# Patient Record
Sex: Female | Born: 1950 | Race: Black or African American | Hispanic: No | Smoking: Former smoker
Health system: Southern US, Community
[De-identification: ages and names within clinical notes are randomized; demographics above are authoritative.]

## PROBLEM LIST (undated history)

## (undated) DIAGNOSIS — I10 Essential (primary) hypertension: Secondary | ICD-10-CM

## (undated) DIAGNOSIS — I2699 Other pulmonary embolism without acute cor pulmonale: Secondary | ICD-10-CM

## (undated) DIAGNOSIS — M792 Neuralgia and neuritis, unspecified: Secondary | ICD-10-CM

## (undated) DIAGNOSIS — K219 Gastro-esophageal reflux disease without esophagitis: Secondary | ICD-10-CM

## (undated) HISTORY — PX: HERNIA REPAIR: SHX51

---

## 2011-01-06 HISTORY — PX: ROTATOR CUFF REPAIR: SHX139

## 2016-03-05 ENCOUNTER — Encounter (HOSPITAL_BASED_OUTPATIENT_CLINIC_OR_DEPARTMENT_OTHER): Payer: Self-pay | Admitting: Emergency Medicine

## 2016-03-05 ENCOUNTER — Emergency Department (HOSPITAL_BASED_OUTPATIENT_CLINIC_OR_DEPARTMENT_OTHER): Payer: Medicare HMO

## 2016-03-05 ENCOUNTER — Observation Stay (HOSPITAL_BASED_OUTPATIENT_CLINIC_OR_DEPARTMENT_OTHER)
Admission: EM | Admit: 2016-03-05 | Discharge: 2016-03-07 | Disposition: A | Discharge status: Home / Self Care | Payer: Medicare HMO | Attending: Internal Medicine | Admitting: Internal Medicine

## 2016-03-05 DIAGNOSIS — I2699 Other pulmonary embolism without acute cor pulmonale: Principal | ICD-10-CM | POA: Diagnosis present

## 2016-03-05 DIAGNOSIS — R079 Chest pain, unspecified: Secondary | ICD-10-CM | POA: Diagnosis present

## 2016-03-05 DIAGNOSIS — I10 Essential (primary) hypertension: Secondary | ICD-10-CM | POA: Insufficient documentation

## 2016-03-05 DIAGNOSIS — M79605 Pain in left leg: Secondary | ICD-10-CM | POA: Diagnosis present

## 2016-03-05 DIAGNOSIS — K219 Gastro-esophageal reflux disease without esophagitis: Secondary | ICD-10-CM | POA: Diagnosis not present

## 2016-03-05 DIAGNOSIS — G629 Polyneuropathy, unspecified: Secondary | ICD-10-CM

## 2016-03-05 DIAGNOSIS — N289 Disorder of kidney and ureter, unspecified: Secondary | ICD-10-CM

## 2016-03-05 HISTORY — DX: Essential (primary) hypertension: I10

## 2016-03-05 HISTORY — DX: Neuralgia and neuritis, unspecified: M79.2

## 2016-03-05 HISTORY — DX: Gastro-esophageal reflux disease without esophagitis: K21.9

## 2016-03-05 LAB — BASIC METABOLIC PANEL
ANION GAP: 6 (ref 5–15)
BUN: 12 mg/dL (ref 6–20)
CO2: 27 mmol/L (ref 22–32)
Calcium: 8.8 mg/dL — ABNORMAL LOW (ref 8.9–10.3)
Chloride: 106 mmol/L (ref 101–111)
Creatinine, Ser: 1.14 mg/dL — ABNORMAL HIGH (ref 0.44–1.00)
GFR calc Af Amer: 56 mL/min — ABNORMAL LOW (ref >60)
GFR, EST NON AFRICAN AMERICAN: 49 mL/min — AB (ref >60)
GLUCOSE: 117 mg/dL — AB (ref 65–99)
POTASSIUM: 3.4 mmol/L — AB (ref 3.5–5.1)
SODIUM: 139 mmol/L (ref 135–145)

## 2016-03-05 LAB — CBC
HEMATOCRIT: 35.8 % — AB (ref 36.0–46.0)
HEMOGLOBIN: 12 g/dL (ref 12.0–15.0)
MCH: 27 pg (ref 26.0–34.0)
MCHC: 33.5 g/dL (ref 30.0–36.0)
MCV: 80.6 fL (ref 78.0–100.0)
Platelets: 204 10*3/uL (ref 150–400)
RBC: 4.44 MIL/uL (ref 3.87–5.11)
RDW: 15.2 % (ref 11.5–15.5)
WBC: 7.1 10*3/uL (ref 4.0–10.5)

## 2016-03-05 LAB — D-DIMER, QUANTITATIVE (NOT AT ARMC): D DIMER QUANT: 2.77 ug{FEU}/mL — AB (ref 0.00–0.50)

## 2016-03-05 LAB — PROTIME-INR
INR: 1.05
PROTHROMBIN TIME: 13.7 s (ref 11.4–15.2)

## 2016-03-05 LAB — TROPONIN I: Troponin I: <0.03 ng/mL (ref <0.03)

## 2016-03-05 MED ORDER — HEPARIN BOLUS VIA INFUSION
3500.0000 [IU] | Freq: Once | INTRAVENOUS | Status: AC
Start: 2016-03-05 — End: 2016-03-05
  Administered 2016-03-05: 3500 [IU] via INTRAVENOUS

## 2016-03-05 MED ORDER — IOPAMIDOL (ISOVUE-370) INJECTION 76%
100.0000 mL | Freq: Once | INTRAVENOUS | Status: AC | PRN
  Administered 2016-03-05: 100 mL via INTRAVENOUS

## 2016-03-05 MED ORDER — SODIUM CHLORIDE 0.9 % IV BOLUS (SEPSIS)
1000.0000 mL | Freq: Once | INTRAVENOUS | Status: AC
  Administered 2016-03-05: 1000 mL via INTRAVENOUS

## 2016-03-05 MED ORDER — HEPARIN (PORCINE) IN NACL 100-0.45 UNIT/ML-% IJ SOLN
1000.0000 [IU]/h | INTRAMUSCULAR | Status: AC
  Administered 2016-03-05: 1000 [IU]/h via INTRAVENOUS
  Filled 2016-03-05: qty 250

## 2016-03-05 NOTE — Progress Notes (Signed)
ANTICOAGULATION CONSULT NOTE - Initial Consult  Pharmacy Consult for heparin Indication: pulmonary embolus  Allergies  Allergen Reactions  . Asa [Aspirin] Other (See Comments)    States "the Dr told me not to take it because of my hernia."    Patient Measurements: Height: 5\' 1"  (154.9 cm) Weight: 148 lb (67.1 kg) IBW/kg (Calculated) : 47.8 Heparin Dosing Weight: 62kg  Vital Signs: BP: 142/68 (03/01 1700) Pulse Rate: 66 (03/01 1700)  Labs:  Recent Labs  03/05/16 1605  HGB 12.0  HCT 35.8*  PLT 204  LABPROT 13.7  INR 1.05  CREATININE 1.14*  TROPONINI <0.03    Estimated Creatinine Clearance: 42 mL/min (by C-G formula based on SCr of 1.14 mg/dL (H)).   Medical History: Past Medical History:  Diagnosis Date  . GERD (gastroesophageal reflux disease)   . Hypertension   . Neuropathic pain of right foot     Medications:  Infusions:  . heparin      Assessment: [age] yof presented to the ED with CP. D-dimer elevated and found to have a PE without right heart strain. To start IV heparin. Baseline CBC is WNL. She is not on anticoagulation PTA.   Goal of Therapy:  Heparin level 0.3-0.7 units/ml Monitor platelets by anticoagulation protocol: Yes   Plan:  Heparin bolus 3500 units IV x 1 Heparin gtt 1000 units/hr Check a 6 hr heparin level Daily heparin level and CBC  Joseguadalupe Stan, Drake Leachachel Lynn 03/05/2016,7:00 PM

## 2016-03-05 NOTE — ED Notes (Signed)
Patient denies pain and is resting comfortably. Given gingerale and snack

## 2016-03-05 NOTE — ED Provider Notes (Signed)
[facility] Provider Note   CSN: [id number] Arrival date & time: 03/05/16  1534     History   Chief Complaint Chief Complaint  Patient presents with  . Chest Pain    HPI Pamela Olson is a 66 y.o. female with a past medical history significant for hypertension and GERD who presents with chest pain. Patient reports that she woke up this morning with chest pain. She describes the pain as located across her chest radiating up towards her right jaw. Patient denies history of chest pain. She denies history of heart disease. She denies family history of heart troubles. She does not smoke. She describes the pain as a tightness. She is currently at a 3 out of 10 severity but was up to a 10 out of 10. It is nonexertional. It is not worse with deep breathing. She has no cough, rhinorrhea, congestion, fevers, or chills. She denies palpitations. She says that she was feeling slightly lightheaded. She denied nausea, vomiting, or diaphoresis. She denies any constipation, diarrhea, or urine troubles.  She does report that she has had increasing anxiety over the last 2 weeks and says she is "scared of everything". She has not taken aspirin today as she appears to have an allergy to it. She denies other symptoms on arrival.   HPI  Past Medical History:  Diagnosis Date  . GERD (gastroesophageal reflux disease)   . Hypertension   . Neuropathic pain of right foot     There are no active problems to display for this patient.   Past Surgical History:  Procedure Laterality Date  . HERNIA REPAIR    . ROTATOR CUFF REPAIR Right 2013    OB History    No data available       Home Medications    Prior to Admission medications   Medication Sig Start Date End Date Taking? Authorizing Provider  gabapentin (NEURONTIN) 300 MG capsule Take 300 mg by mouth 3 (three) times daily.   Yes Historical Provider, MD  losartan (COZAAR) 100 MG tablet Take 100 mg by mouth daily.   Yes Historical  Provider, MD  omeprazole (PRILOSEC) 20 MG capsule Take 20 mg by mouth daily.   Yes Historical Provider, MD  TRAZODONE HCL PO Take by mouth at bedtime.   Yes Historical Provider, MD    Family History No family history on file.  Social History Social History  Substance Use Topics  . Smoking status: Former Games developer  . Smokeless tobacco: Never Used  . Alcohol use No     Allergies   Asa [aspirin]   Review of Systems Review of Systems  Constitutional: Negative for chills, diaphoresis, fatigue and fever.  HENT: Negative for congestion and rhinorrhea.   Eyes: Negative for visual disturbance.  Respiratory: Positive for chest tightness and shortness of breath. Negative for cough, wheezing and stridor.   Cardiovascular: Positive for chest pain. Negative for palpitations and leg swelling.  Gastrointestinal: Negative for abdominal pain, constipation, diarrhea, nausea and vomiting.  Genitourinary: Negative for dysuria.  Musculoskeletal: Negative for back pain, neck pain and neck stiffness.  Skin: Negative for rash and wound.  Neurological: Negative for syncope, light-headedness, numbness and headaches.  Psychiatric/Behavioral: Negative for agitation.  All other systems reviewed and are negative.    Physical Exam Updated Vital Signs Ht  (1.549 m)   Wt 148 lb (67.1 kg)   SpO2 97%   BMI 27.96 kg/m   Physical Exam  Constitutional: She is oriented to person, place, and  time. She appears well-developed and well-nourished. No distress.  HENT:  Head: Normocephalic and atraumatic.  Right Ear: External ear normal.  Left Ear: External ear normal.  Nose: Nose normal.  Mouth/Throat: Oropharynx is clear and moist. No oropharyngeal exudate.  Eyes: Conjunctivae and EOM are normal. Pupils are equal, round, and reactive to light.  Neck: Normal range of motion. Neck supple.  Cardiovascular: Normal rate, regular rhythm, normal heart sounds and intact distal pulses.   No murmur  heard. Pulmonary/Chest: Effort normal and breath sounds normal. No stridor. No respiratory distress. She has no wheezes. She has no rales. She exhibits no tenderness.  Abdominal: She exhibits no distension. There is no tenderness. There is no rebound.  Musculoskeletal: She exhibits no tenderness.  Neurological: She is alert and oriented to person, place, and time. She has normal reflexes. No cranial nerve deficit or sensory deficit. She exhibits normal muscle tone.  Skin: Skin is warm. Capillary refill takes less than 2 seconds. No rash noted. She is not diaphoretic. No erythema.  Psychiatric: She has a normal mood and affect.  Nursing note and vitals reviewed.    ED Treatments / Results  Labs (all labs ordered are listed, but only abnormal results are displayed) Labs Reviewed  BASIC METABOLIC PANEL - Abnormal; Notable for the following:       Result Value   Potassium 3.4 (*)    Glucose, Bld 117 (*)    Creatinine, Ser 1.14 (*)    Calcium 8.8 (*)    GFR calc non Af Amer 49 (*)    GFR calc Af Amer 56 (*)    All other components within normal limits  CBC - Abnormal; Notable for the following:    HCT 35.8 (*)    All other components within normal limits  D-DIMER, QUANTITATIVE (NOT AT [facility]) - Abnormal; Notable for the following:    D-Dimer, Quant 2.77 (*)    All other components within normal limits  CBC - Abnormal; Notable for the following:    Hemoglobin 11.3 (*)    HCT 34.9 (*)    All other components within normal limits  BASIC METABOLIC PANEL - Abnormal; Notable for the following:    Glucose, Bld 118 (*)    All other components within normal limits  CBC - Abnormal; Notable for the following:    Hemoglobin 11.8 (*)    All other components within normal limits  TROPONIN I  PROTIME-INR  TROPONIN I  HEPARIN LEVEL (UNFRACTIONATED)  TROPONIN I  TROPONIN I    EKG  EKG Interpretation  Date/Time:  Thursday March 05 2016 15:42:22 EST Ventricular Rate:  67 PR  Interval:  168 QRS Duration: 80 QT Interval:  390 QTC Calculation: 412 R Axis:   25 Text Interpretation:  Normal sinus rhythm Nonspecific T wave abnormality Abnormal ECG No prior ECG for comparison.  No STEMI Confirmed by Rush Landmark MD, Arnel Wymer [phone]) on 03/05/2016 6:35:25 PM       Radiology Dg Chest 2 View  Result Date: 03/05/2016 CLINICAL DATA:  Chest pain, dizziness, shortness of breath. History of hypertension, former smoker. EXAM: CHEST  2 VIEW COMPARISON:  None in PACs FINDINGS: The lungs are adequately inflated. There is subtle increased density in the right upper lobe. The cardiac silhouette is top-normal in size. The pulmonary vascularity is normal. There is no pleural effusion or pneumothorax. The mediastinum is normal in width. The bony thorax exhibits no acute abnormality. IMPRESSION: Subtle increased interstitial density in the right upper lobe which may  reflect subsegmental atelectasis or early pneumonia. A pulmonary mass is felt less likely. Followup PA and lateral chest X-ray is recommended in 3-4 weeks following trial of antibiotic therapy to ensure resolution and exclude underlying malignancy. Electronically Signed   By: David  [place] M.D.   On: 03/05/2016 16:45   Ct Angio Chest Pe W And/or Wo Contrast  Result Date: 03/05/2016 CLINICAL DATA:  Right chest pain radiating to the left. EXAM: CT ANGIOGRAPHY CHEST WITH CONTRAST TECHNIQUE: Multidetector CT imaging of the chest was performed using the standard protocol during bolus administration of intravenous contrast. Multiplanar CT image reconstructions and MIPs were obtained to evaluate the vascular anatomy. CONTRAST:  100 cc Isovue 370 IV COMPARISON:  Chest x-ray performed today. FINDINGS: Cardiovascular: Filling defects are noted in right lower lobe and right upper lobe pulmonary artery is compatible with small to medium sized pulmonary emboli. Filling defects also noted in the left lower lobe pulmonary arterial branches. No evidence  of right heart strain. Heart is normal size. Aorta is normal caliber. Mediastinum/Nodes: No mediastinal, hilar, or axillary adenopathy. Lungs/Pleura: Lungs are clear. No focal airspace opacities or suspicious nodules. No effusions. Previously seen density projecting over the right upper lobe on chest x-ray likely reflected overlapping shadows or bony structures. Upper Abdomen: Imaging into the upper abdomen shows no acute findings. Prior cholecystectomy Musculoskeletal: Chest wall soft tissues are unremarkable. No acute bony abnormality. Review of the MIP images confirms the above findings. IMPRESSION: Filling defects in the pulmonary artery is in the right upper lobe, right lower lobe and left lower lobe compatible with small to medium sized pulmonary emboli. No evidence of right heart strain. Critical Value/emergent results were called by telephone at the time of interpretation on 03/05/2016 at 6:33 pm to Dr. Lynden Oxford , who verbally acknowledged these results. Electronically Signed   By: Charlett Nose M.D.   On: 03/05/2016 18:34    Procedures Procedures (including critical care time)  CRITICAL CARE Performed by: Canary Brim Kesley Gaffey Total critical care time: 45 minutes Critical care time was exclusive of separately billable procedures and treating other patients. Critical care was necessary to treat or prevent imminent or life-threatening deterioration. Critical care was time spent personally by me on the following activities: development of treatment plan with patient and/or surrogate as well as nursing, discussions with consultants, evaluation of patient's response to treatment, examination of patient, obtaining history from patient or surrogate, ordering and performing treatments and interventions, ordering and review of laboratory studies, ordering and review of radiographic studies, pulse oximetry and re-evaluation of patient's condition.   Medications Ordered in ED Medications   heparin ADULT infusion 100 units/mL (25000 units/272mL sodium chloride 0.45%) (1,000 Units/hr Intravenous New Bag/Given 03/05/16 1931)  sodium chloride 0.9 % bolus 1,000 mL (0 mLs Intravenous Stopped 03/05/16 1931)  iopamidol (ISOVUE-370) 76 % injection 100 mL (100 mLs Intravenous Contrast Given 03/05/16 1816)  heparin bolus via infusion 3,500 Units (3,500 Units Intravenous Bolus from Bag 03/05/16 1928)     Initial Impression / Assessment and Plan / ED Course  I have reviewed the triage vital signs and the nursing notes.  Pertinent labs & imaging results that were available during my care of the patient were reviewed by me and considered in my medical decision making (see chart for details).     Pamela Olson is a 66 y.o. female with a past medical history significant for hypertension and GERD who presents with chest pain.  History and exam are seen above.  On exam,  patient's chest is nontender. Lungs are clear. No significant murmurs. Abdomen nontender. No lower extremity edema seen. No joint tenderness. Symmetric pulses in all extremity. No focal neurologic deficits.  Based on patient's symptoms, she'll workup to determine etiology of chest pain. She will have troponins, d-dimer will be added due to her chest pain, age, and shortness of breath. Patient will have chest x-ray, EKG, and we given fluids due to the lightheadedness. Aspirin will be held due to allergy.  Anticipate reassessment following workup results. Pt's pain is currently minimal.   Initial EKG performed showing nonspecific T-wave abnormalities but no evidence of acute ischemia And no prior EKG for comparison. Initial chest x-ray shows possible pneumonia however White blood cell count nonelevated. Troponin negative however, D dimer was elevated. CT scan ordered and patient found to have bilateral pulmonary emboli.  Patient started on heparin and patient was admitted for further management of new pulmonary emboli.    Final  Clinical Impressions(s) / ED Diagnoses   Final diagnoses:  Other acute pulmonary embolism without acute cor pulmonale (HCC)     Clinical Impression: 1. Other acute pulmonary embolism without acute cor pulmonale (HCC)     Disposition: Admit to hospitalist service    Heide Scales, MD 03/06/16 1002

## 2016-03-05 NOTE — ED Notes (Signed)
Attempt[facility] to give [facility] but is unavailable, will call back

## 2016-03-05 NOTE — ED Notes (Signed)
Patient denies pain and is resting comfortably.  

## 2016-03-05 NOTE — ED Triage Notes (Signed)
Pt states she awoke with pain and tightness in right chest radiating to left and then right jaw. Reports dizziness. Hx of HTN. NAD at triage. Pain 3/10.

## 2016-03-06 ENCOUNTER — Observation Stay (HOSPITAL_BASED_OUTPATIENT_CLINIC_OR_DEPARTMENT_OTHER): Payer: Medicare HMO

## 2016-03-06 DIAGNOSIS — N289 Disorder of kidney and ureter, unspecified: Secondary | ICD-10-CM

## 2016-03-06 DIAGNOSIS — I2699 Other pulmonary embolism without acute cor pulmonale: Secondary | ICD-10-CM

## 2016-03-06 DIAGNOSIS — G629 Polyneuropathy, unspecified: Secondary | ICD-10-CM | POA: Diagnosis not present

## 2016-03-06 DIAGNOSIS — M79605 Pain in left leg: Secondary | ICD-10-CM | POA: Diagnosis present

## 2016-03-06 DIAGNOSIS — R079 Chest pain, unspecified: Secondary | ICD-10-CM | POA: Diagnosis not present

## 2016-03-06 DIAGNOSIS — K219 Gastro-esophageal reflux disease without esophagitis: Secondary | ICD-10-CM | POA: Diagnosis present

## 2016-03-06 LAB — CBC
HCT: 34.9 % — ABNORMAL LOW (ref 36.0–46.0)
HEMATOCRIT: 36.5 % (ref 36.0–46.0)
Hemoglobin: 11.3 g/dL — ABNORMAL LOW (ref 12.0–15.0)
Hemoglobin: 11.8 g/dL — ABNORMAL LOW (ref 12.0–15.0)
MCH: 26.3 pg (ref 26.0–34.0)
MCH: 26.3 pg (ref 26.0–34.0)
MCHC: 32.4 g/dL (ref 30.0–36.0)
MCHC: 32.3 g/dL (ref 30.0–36.0)
MCV: 81.2 fL (ref 78.0–100.0)
MCV: 81.5 fL (ref 78.0–100.0)
PLATELETS: 196 10*3/uL (ref 150–400)
PLATELETS: 188 10*3/uL (ref 150–400)
RBC: 4.3 MIL/uL (ref 3.87–5.11)
RBC: 4.48 MIL/uL (ref 3.87–5.11)
RDW: 15.1 % (ref 11.5–15.5)
RDW: 15.4 % (ref 11.5–15.5)
WBC: 8.2 10*3/uL (ref 4.0–10.5)
WBC: 7.6 10*3/uL (ref 4.0–10.5)

## 2016-03-06 LAB — ECHOCARDIOGRAM COMPLETE
HEIGHTINCHES: 61 in
Weight: 2528 oz

## 2016-03-06 LAB — BASIC METABOLIC PANEL
ANION GAP: 8 (ref 5–15)
BUN: 11 mg/dL (ref 6–20)
CALCIUM: 8.9 mg/dL (ref 8.9–10.3)
CO2: 25 mmol/L (ref 22–32)
CREATININE: 0.92 mg/dL (ref 0.44–1.00)
Chloride: 106 mmol/L (ref 101–111)
Glucose, Bld: 118 mg/dL — ABNORMAL HIGH (ref 65–99)
Potassium: 3.6 mmol/L (ref 3.5–5.1)
SODIUM: 139 mmol/L (ref 135–145)

## 2016-03-06 LAB — TROPONIN I: Troponin I: <0.03 ng/mL (ref <0.03)

## 2016-03-06 LAB — HEPARIN LEVEL (UNFRACTIONATED): HEPARIN UNFRACTIONATED: 0.57 [IU]/mL (ref 0.30–0.70)

## 2016-03-06 MED ORDER — MORPHINE SULFATE (PF) 2 MG/ML IV SOLN: 2.0000 mg | INTRAVENOUS | Status: DC | PRN

## 2016-03-06 MED ORDER — ACETAMINOPHEN 650 MG RE SUPP: 650.0000 mg | Freq: Four times a day (QID) | RECTAL | Status: DC | PRN

## 2016-03-06 MED ORDER — IPRATROPIUM-ALBUTEROL 0.5-2.5 (3) MG/3ML IN SOLN: 3.0000 mL | RESPIRATORY_TRACT | Status: DC | PRN

## 2016-03-06 MED ORDER — ACETAMINOPHEN 325 MG PO TABS: 650.0000 mg | ORAL_TABLET | Freq: Four times a day (QID) | ORAL | Status: DC | PRN

## 2016-03-06 MED ORDER — LOSARTAN POTASSIUM 50 MG PO TABS
100.0000 mg | ORAL_TABLET | Freq: Every day | ORAL | Status: DC
  Administered 2016-03-06 – 2016-03-07 (×2): 100 mg via ORAL
  Filled 2016-03-06 (×2): qty 2

## 2016-03-06 MED ORDER — POTASSIUM CHLORIDE CRYS ER 20 MEQ PO TBCR
40.0000 meq | EXTENDED_RELEASE_TABLET | ORAL | Status: AC
  Administered 2016-03-06: 40 meq via ORAL
  Filled 2016-03-06: qty 2

## 2016-03-06 MED ORDER — ONDANSETRON HCL 4 MG/2ML IJ SOLN: 4.0000 mg | Freq: Four times a day (QID) | INTRAMUSCULAR | Status: DC | PRN

## 2016-03-06 MED ORDER — PANTOPRAZOLE SODIUM 40 MG PO TBEC
40.0000 mg | DELAYED_RELEASE_TABLET | Freq: Every day | ORAL | Status: DC
  Administered 2016-03-06 – 2016-03-07 (×2): 40 mg via ORAL
  Filled 2016-03-06 (×3): qty 1

## 2016-03-06 MED ORDER — HYDROCODONE-ACETAMINOPHEN 5-325 MG PO TABS
1.0000 | ORAL_TABLET | ORAL | Status: DC | PRN
  Administered 2016-03-07: 1 via ORAL
  Filled 2016-03-06: qty 1

## 2016-03-06 MED ORDER — ONDANSETRON HCL 4 MG PO TABS: 4.0000 mg | ORAL_TABLET | Freq: Four times a day (QID) | ORAL | Status: DC | PRN

## 2016-03-06 MED ORDER — GABAPENTIN 300 MG PO CAPS
300.0000 mg | ORAL_CAPSULE | Freq: Three times a day (TID) | ORAL | Status: DC
  Administered 2016-03-06 – 2016-03-07 (×4): 300 mg via ORAL
  Filled 2016-03-06 (×4): qty 1

## 2016-03-06 MED ORDER — RIVAROXABAN 15 MG PO TABS
15.0000 mg | ORAL_TABLET | Freq: Two times a day (BID) | ORAL | Status: DC
  Administered 2016-03-06 – 2016-03-07 (×3): 15 mg via ORAL
  Filled 2016-03-06 (×3): qty 1

## 2016-03-06 NOTE — Care Management Obs Status (Signed)
MEDICARE OBSERVATION STATUS NOTIFICATION   Patient Details  Name: Pamela RansomSonia Olson MRN: [id number] Date of Birth: 1950/04/08   Medicare Observation Status Notification Given:  Yes    Gala LewandowskyGraves-Bigelow, Xuan Mateus Kaye, RN 03/06/2016, 11:46 AM

## 2016-03-06 NOTE — H&P (Addendum)
History and Physical    Pamela Olson ZOX:[id number] DOB: Apr 03, 1950 DOA: 03/05/2016  Referring MD/NP/PA: Dr. Minerva Areola PCP: Malka So., MD  Patient coming from: [facility]  Chief Complaint: Chest pain  HPI: Pamela Olson is a 66 y.o. female with medical history significant of HTN, neuropathy, and GERD; who presents with complaints of acute onset of chest pain yesterday morning after waking up. Pain was described as sharp, located across the lower part of her chest, and radiated up into her neck. Associated symptoms included shortness of breath with exertion x 1 week, left leg pain(new), and some lightheadedness. Denies any fever, palpitations, cough, nausea, vomiting, diarrhea, constipation, dysuria, leg swelling, prolonged immobilization, trips, family history of bleeding/clotting disorder, history of malignancy, or weight loss. She has no previous history of blood clots and is currently not on any blood thinners. Patient notes allergy to aspirin.  ED Course: Upon admission to the emergency department patient was seen to be afebrile, pulse 56-69, respirations 12-24, and all other vitals within normal limits. Lab work revealed potassium 3.4, BUN 12, creatinine 1.14, and troponins negative 2. Patient was started on a heparin drip per pharmacy. TRH to admit. Transfered from [facility] to a telemetry bed here at [facility].  Review of Systems: As per HPI otherwise 10 point review of systems negative.   Past Medical History:  Diagnosis Date  . GERD (gastroesophageal reflux disease)   . Hypertension   . Neuropathic pain of right foot     Past Surgical History:  Procedure Laterality Date  . HERNIA REPAIR    . ROTATOR CUFF REPAIR Right 2013     reports that she has quit smoking. She has never used smokeless tobacco. She reports that she does not drink alcohol or use drugs.  Allergies  Allergen Reactions  . Asa [Aspirin] Other (See Comments)    States "the Dr told me not to take it because of my  hernia."    No family history on file.  Prior to Admission medications   Medication Sig Start Date End Date Taking? Authorizing Provider  gabapentin (NEURONTIN) 300 MG capsule Take 300 mg by mouth 3 (three) times daily.   Yes Historical Provider, MD  losartan (COZAAR) 100 MG tablet Take 100 mg by mouth daily.   Yes Historical Provider, MD  omeprazole (PRILOSEC) 20 MG capsule Take 20 mg by mouth daily.   Yes Historical Provider, MD  TRAZODONE HCL PO Take by mouth at bedtime.   Yes Historical Provider, MD    Physical Exam:   Constitutional: Elderly female in some mild distress. Vitals:   03/05/16 2012 03/05/16 2015 03/05/16 2045 03/05/16 2217  BP: (!) 149/54   (!) 150/63  Pulse: 63 66 69 61  Resp: 18 19 15  (!) 22  Temp: 98 F (36.7 C)   98.4 F (36.9 C)  TempSrc: Oral   Oral  SpO2: 100% 98% 100% 99%  Weight:    71.4 kg (157 lb 8 oz)  Height:    5\' 1"  (1.549 m)   Eyes: PERRL, lids and conjunctivae normal ENMT: Mucous membranes are moist. Posterior pharynx clear of any exudate or lesions.Normal dentition.  Neck: normal, supple, no masses, no thyromegaly Respiratory: clear to auscultation bilaterally, no wheezing, no crackles. Normal respiratory effort. No accessory muscle use.  Cardiovascular: Regular rate and rhythm, no murmurs / rubs / gallops. No extremity edema. 2+ pedal pulses. No carotid bruits.  Abdomen: no tenderness, no masses palpated. No hepatosplenomegaly. Bowel sounds positive.  Musculoskeletal: no clubbing /  cyanosis. No joint deformity upper and lower extremities. Good ROM, no contractures. Normal muscle tone. Left leg calf tenderness present.  Skin: no rashes, lesions, ulcers. No induration Neurologic: CN 2-12 grossly intact. Sensation intact, DTR normal. Strength 5/5 in all 4.  Psychiatric: Normal judgment and insight. Alert and oriented x 3. Normal mood.     Labs on Admission: I have personally reviewed following labs and imaging studies  CBC:  Recent  Labs Lab 03/05/16 1605  WBC 7.1  HGB 12.0  HCT 35.8*  MCV 80.6  PLT 204   Basic Metabolic Panel:  Recent Labs Lab 03/05/16 1605  NA 139  K 3.4*  CL 106  CO2 27  GLUCOSE 117*  BUN 12  CREATININE 1.14*  CALCIUM 8.8*   GFR: Estimated Creatinine Clearance: 43.2 mL/min (by C-G formula based on SCr of 1.14 mg/dL (H)). Liver Function Tests: No results for input(s): AST, ALT, ALKPHOS, BILITOT, PROT, ALBUMIN in the last 168 hours. No results for input(s): LIPASE, AMYLASE in the last 168 hours. No results for input(s): AMMONIA in the last 168 hours. Coagulation Profile:  Recent Labs Lab 03/05/16 1605  INR 1.05   Cardiac Enzymes:  Recent Labs Lab 03/05/16 1605 03/05/16 1859  TROPONINI <0.03 <0.03   BNP (last 3 results) No results for input(s): PROBNP in the last 8760 hours. HbA1C: No results for input(s): HGBA1C in the last 72 hours. CBG: No results for input(s): GLUCAP in the last 168 hours. Lipid Profile: No results for input(s): CHOL, HDL, LDLCALC, TRIG, CHOLHDL, LDLDIRECT in the last 72 hours. Thyroid Function Tests: No results for input(s): TSH, T4TOTAL, FREET4, T3FREE, THYROIDAB in the last 72 hours. Anemia Panel: No results for input(s): VITAMINB12, FOLATE, FERRITIN, TIBC, IRON, RETICCTPCT in the last 72 hours. Urine analysis: No results found for: COLORURINE, APPEARANCEUR, LABSPEC, PHURINE, GLUCOSEU, HGBUR, BILIRUBINUR, KETONESUR, PROTEINUR, UROBILINOGEN, NITRITE, LEUKOCYTESUR Sepsis Labs: No results found for this or any previous visit (from the past 240 hour(s)).   Radiological Exams on Admission: Dg Chest 2 View  Result Date: 03/05/2016 CLINICAL DATA:  Chest pain, dizziness, shortness of breath. History of hypertension, former smoker. EXAM: CHEST  2 VIEW COMPARISON:  None in PACs FINDINGS: The lungs are adequately inflated. There is subtle increased density in the right upper lobe. The cardiac silhouette is top-normal in size. The pulmonary vascularity  is normal. There is no pleural effusion or pneumothorax. The mediastinum is normal in width. The bony thorax exhibits no acute abnormality. IMPRESSION: Subtle increased interstitial density in the right upper lobe which may reflect subsegmental atelectasis or early pneumonia. A pulmonary mass is felt less likely. Followup PA and lateral chest X-ray is recommended in 3-4 weeks following trial of antibiotic therapy to ensure resolution and exclude underlying malignancy. Electronically Signed   By: David  [place] M.D.   On: 03/05/2016 16:45   Ct Angio Chest Pe W And/or Wo Contrast  Result Date: 03/05/2016 CLINICAL DATA:  Right chest pain radiating to the left. EXAM: CT ANGIOGRAPHY CHEST WITH CONTRAST TECHNIQUE: Multidetector CT imaging of the chest was performed using the standard protocol during bolus administration of intravenous contrast. Multiplanar CT image reconstructions and MIPs were obtained to evaluate the vascular anatomy. CONTRAST:  100 cc Isovue 370 IV COMPARISON:  Chest x-ray performed today. FINDINGS: Cardiovascular: Filling defects are noted in right lower lobe and right upper lobe pulmonary artery is compatible with small to medium sized pulmonary emboli. Filling defects also noted in the left lower lobe pulmonary arterial branches. No evidence  of right heart strain. Heart is normal size. Aorta is normal caliber. Mediastinum/Nodes: No mediastinal, hilar, or axillary adenopathy. Lungs/Pleura: Lungs are clear. No focal airspace opacities or suspicious nodules. No effusions. Previously seen density projecting over the right upper lobe on chest x-ray likely reflected overlapping shadows or bony structures. Upper Abdomen: Imaging into the upper abdomen shows no acute findings. Prior cholecystectomy Musculoskeletal: Chest wall soft tissues are unremarkable. No acute bony abnormality. Review of the MIP images confirms the above findings. IMPRESSION: Filling defects in the pulmonary artery is in the right  upper lobe, right lower lobe and left lower lobe compatible with small to medium sized pulmonary emboli. No evidence of right heart strain. Critical Value/emergent results were called by telephone at the time of interpretation on 03/05/2016 at 6:33 pm to Dr. Lynden OxfordHRISTOPHER TEGELER , who verbally acknowledged these results. Electronically Signed   By: Charlett NoseKevin  Dover M.D.   On: 03/05/2016 18:34    EKG: Independently reviewed. Normal sinus rhythm with nonspecific T-wave changes.  Assessment/Plan Pulmonary embolus (HCC) : Acute. Patient does not give significant risk factors for symptoms. CT angiogram showing acute right upper lobe pulmonary embolus without signs of significant right heart strain.  - Admit to a telemetry bed - Heparin per pharmacy   - Check echocardiogram in a.m. - Continuous pulse oximetry overnight - DuoNeb's prn SOB/wheezing - Determine best oral anticoagulation agent for patient   Chest pain: Acute. Suspect secondary to above. Initial troponin negative. She reports aspirin allergy. - Trend cardiac enzymes - Hydrocodone/morphine IV when necessary moderate-to-severe pain respectively  Left leg pain: Acute. - Check Vas duplex doppler U/S in a.m.  Essential hypertension - Continue losartan  Neuropathy - Continue gabapentin  Renal insufficiency: Acute. Patient presents with a creatinine of 1.14 on admission. Review of care everywhere record shows a baseline creatinine previously 0.85-1.05 and 2017. - Recheck BMP in a.m.  GERD -  Pharmacy substitution of Protonix for Prilosec DVT prophylaxis:  Heparin gtt Code Status: Full code Family Communication: No family present at bedside Disposition Plan: We discharged home once medically stable and transition to oral medication Consults called: none Admission status: Observation  Clydie Braunondell A Smith MD [facility] Pager [phone]- [phone]  If 7PM-7AM, please contact night-coverage [email or web address] Password [email or web address]  03/06/2016, 12:45  AM

## 2016-03-06 NOTE — Progress Notes (Signed)
PROGRESS NOTE        PATIENT DETAILS Name: Pamela Olson Age: 66 y.o. Sex: female Date of Birth: 1950/10/22 Admit Date: 03/05/2016 Admitting Physician Clydie Braun, MD ZOX:WRUE,AVWUJW B., MD  Brief Narrative: Patient is a 66 y.o. female-no prior history of VTE-resented to the hospital with chest pain and shortness of breath, CT angiogram of the chest was positive for pulmonary embolism. Admitted for further evaluation and treatment. See below for further details  Subjective: No chest pain, feels significantly better. Has not ambulated yet.  Assessment/Plan: Pulmonary embolism: No recent travel or surgery-not on any hormone supplementation-claims to be very physically active-likely a unprovoked pulmonary embolism. She claims to be up to date with her cancer screening. No clinical signs of RV strain, echocardiogram shows normal RV systolic function. Stop heparin, start Xarelto (after discussion of options, Coumadin vs NOAC), ambulate in the hallway-and if continues to do well, suspect can be discharged home on 3/3. Patient is aware that she will need prolonged anticoagulation, and a outpatient hematology evaluation prior to discontinuing anticoagulation. Lower extremity Doppler pending.  Hypertension: Controlled with losartan  GERD: Continue PPI.  History of peripheral neuropathy: Continue Neurontin.  DVT Prophylaxis: Full dose anticoagulation with Xarelto  Code Status: Full code   Family Communication: None at bedside  Disposition Plan: Remain inpatient-home likely on 3/3  Antimicrobial agents: Anti-infectives    None      Procedures: Echo 3/2>> Normal LV systolic function; grade 1 diastolic dysfunction; mild  LVH. Right ventricle:  The cavity size was normal. Systolic function was normal.  CONSULTS:  None  Time spent: 25 minutes-Greater than 50% of this time was spent in counseling, explanation of diagnosis, planning of further  management, and coordination of care.  MEDICATIONS: Scheduled Meds: . gabapentin  300 mg Oral TID  . losartan  100 mg Oral Daily  . pantoprazole  40 mg Oral Daily  . Rivaroxaban  15 mg Oral BID WC   Continuous Infusions: PRN Meds:.acetaminophen **OR** acetaminophen, HYDROcodone-acetaminophen, ipratropium-albuterol, morphine injection, ondansetron **OR** ondansetron (ZOFRAN) IV   PHYSICAL EXAM: Vital signs: Vitals:   03/05/16 2015 03/05/16 2045 03/05/16 2217 03/06/16 0452  BP:   (!) 150/63 (!) 155/68  Pulse: 66 69 61 63  Resp: 19 15 (!) 22 17  Temp:   98.4 F (36.9 C) 97.8 F (36.6 C)  TempSrc:   Oral Oral  SpO2: 98% 100% 99% 98%  Weight:   71.4 kg (157 lb 8 oz) 71.7 kg (158 lb)  Height:   5\' 1"  (1.549 m)    Filed Weights   03/05/16 1540 03/05/16 2217 03/06/16 0452  Weight: 67.1 kg (148 lb) 71.4 kg (157 lb 8 oz) 71.7 kg (158 lb)   Body mass index is 29.85 kg/m.   General appearance :Awake, alert, not in any distress. Speech Clear. Not toxic Looking Eyes:, pupils equally reactive to light and accomodation,no scleral icterus. HEENT: Atraumatic and Normocephalic Neck: supple, no JVD. No cervical lymphadenopathy. No thyromegaly Resp:Good air entry bilaterally, no added sounds  CVS: S1 S2 regular, no murmurs.  GI: Bowel sounds present, Non tender and not distended with no gaurding, rigidity or rebound. Extremities: B/L Lower Ext shows no edema, both legs are warm to touch Neurology:  speech clear,Non focal, sensation is grossly intact. Psychiatric: Normal judgment and insight. Alert and oriented x 3.  Musculoskeletal:No digital cyanosis Skin:No  Rash, warm and dry Wounds:N/A  I have personally reviewed following labs and imaging studies  LABORATORY DATA: CBC:  Recent Labs Lab 03/05/16 1605 03/06/16 0158 03/06/16 0550  WBC 7.1 8.2 7.6  HGB 12.0 11.3* 11.8*  HCT 35.8* 34.9* 36.5  MCV 80.6 81.2 81.5  PLT 204 196 188    Basic Metabolic Panel:  Recent Labs Lab  03/05/16 1605 03/06/16 0158  NA 139 139  K 3.4* 3.6  CL 106 106  CO2 27 25  GLUCOSE 117* 118*  BUN 12 11  CREATININE 1.14* 0.92  CALCIUM 8.8* 8.9    GFR: Estimated Creatinine Clearance: 53.8 mL/min (by C-G formula based on SCr of 0.92 mg/dL).  Liver Function Tests: No results for input(s): AST, ALT, ALKPHOS, BILITOT, PROT, ALBUMIN in the last 168 hours. No results for input(s): LIPASE, AMYLASE in the last 168 hours. No results for input(s): AMMONIA in the last 168 hours.  Coagulation Profile:  Recent Labs Lab 03/05/16 1605  INR 1.05    Cardiac Enzymes:  Recent Labs Lab 03/05/16 1605 03/05/16 1859 03/06/16 0158 03/06/16 0550  TROPONINI <0.03 <0.03 <0.03 <0.03    BNP (last 3 results) No results for input(s): PROBNP in the last 8760 hours.  HbA1C: No results for input(s): HGBA1C in the last 72 hours.  CBG: No results for input(s): GLUCAP in the last 168 hours.  Lipid Profile: No results for input(s): CHOL, HDL, LDLCALC, TRIG, CHOLHDL, LDLDIRECT in the last 72 hours.  Thyroid Function Tests: No results for input(s): TSH, T4TOTAL, FREET4, T3FREE, THYROIDAB in the last 72 hours.  Anemia Panel: No results for input(s): VITAMINB12, FOLATE, FERRITIN, TIBC, IRON, RETICCTPCT in the last 72 hours.  Urine analysis: No results found for: COLORURINE, APPEARANCEUR, LABSPEC, PHURINE, GLUCOSEU, HGBUR, BILIRUBINUR, KETONESUR, PROTEINUR, UROBILINOGEN, NITRITE, LEUKOCYTESUR  Sepsis Labs: Lactic Acid, Venous No results found for: LATICACIDVEN  MICROBIOLOGY: No results found for this or any previous visit (from the past 240 hour(s)).  RADIOLOGY STUDIES/RESULTS: Dg Chest 2 View  Result Date: 03/05/2016 CLINICAL DATA:  Chest pain, dizziness, shortness of breath. History of hypertension, former smoker. EXAM: CHEST  2 VIEW COMPARISON:  None in PACs FINDINGS: The lungs are adequately inflated. There is subtle increased density in the right upper lobe. The cardiac silhouette  is top-normal in size. The pulmonary vascularity is normal. There is no pleural effusion or pneumothorax. The mediastinum is normal in width. The bony thorax exhibits no acute abnormality. IMPRESSION: Subtle increased interstitial density in the right upper lobe which may reflect subsegmental atelectasis or early pneumonia. A pulmonary mass is felt less likely. Followup PA and lateral chest X-ray is recommended in 3-4 weeks following trial of antibiotic therapy to ensure resolution and exclude underlying malignancy. Electronically Signed   By: David  [place] M.D.   On: 03/05/2016 16:45   Ct Angio Chest Pe W And/or Wo Contrast  Result Date: 03/05/2016 CLINICAL DATA:  Right chest pain radiating to the left. EXAM: CT ANGIOGRAPHY CHEST WITH CONTRAST TECHNIQUE: Multidetector CT imaging of the chest was performed using the standard protocol during bolus administration of intravenous contrast. Multiplanar CT image reconstructions and MIPs were obtained to evaluate the vascular anatomy. CONTRAST:  100 cc Isovue 370 IV COMPARISON:  Chest x-ray performed today. FINDINGS: Cardiovascular: Filling defects are noted in right lower lobe and right upper lobe pulmonary artery is compatible with small to medium sized pulmonary emboli. Filling defects also noted in the left lower lobe pulmonary arterial branches. No evidence of right heart strain. Heart  is normal size. Aorta is normal caliber. Mediastinum/Nodes: No mediastinal, hilar, or axillary adenopathy. Lungs/Pleura: Lungs are clear. No focal airspace opacities or suspicious nodules. No effusions. Previously seen density projecting over the right upper lobe on chest x-ray likely reflected overlapping shadows or bony structures. Upper Abdomen: Imaging into the upper abdomen shows no acute findings. Prior cholecystectomy Musculoskeletal: Chest wall soft tissues are unremarkable. No acute bony abnormality. Review of the MIP images confirms the above findings. IMPRESSION: Filling  defects in the pulmonary artery is in the right upper lobe, right lower lobe and left lower lobe compatible with small to medium sized pulmonary emboli. No evidence of right heart strain. Critical Value/emergent results were called by telephone at the time of interpretation on 03/05/2016 at 6:33 pm to Dr. Lynden OxfordHRISTOPHER TEGELER , who verbally acknowledged these results. Electronically Signed   By: Charlett NoseKevin  Dover M.D.   On: 03/05/2016 18:34     LOS: 0 days   Jeoffrey MassedGHIMIRE,Octavio Matheney, MD  [facility] Pager:[phone]  If 7PM-7AM, please contact night-coverage [email or web address] Password [email or web address] 03/06/2016, 11:31 AM

## 2016-03-06 NOTE — Progress Notes (Signed)
ANTICOAGULATION CONSULT NOTE - Follow Up Consult  Pharmacy Consult for Heparin  Indication: pulmonary embolus  Allergies  Allergen Reactions  . Asa [Aspirin] Other (See Comments)    States "the Dr told me not to take it because of my hernia."   Patient Measurements: Height: 5\' 1"  (154.9 cm) Weight: 157 lb 8 oz (71.4 kg) IBW/kg (Calculated) : 47.8  Vital Signs: Temp: 98.4 F (36.9 C) (03/01 2217) Temp Source: Oral (03/01 2217) BP: 150/63 (03/01 2217) Pulse Rate: 61 (03/01 2217)  Labs:  Recent Labs  03/05/16 1605 03/05/16 1859 03/06/16 0158  HGB 12.0  --  11.3*  HCT 35.8*  --  34.9*  PLT 204  --  196  LABPROT 13.7  --   --   INR 1.05  --   --   HEPARINUNFRC  --   --  0.57  CREATININE 1.14*  --  0.92  TROPONINI <0.03 <0.03 <0.03    Estimated Creatinine Clearance: 53.6 mL/min (by C-G formula based on SCr of 0.92 mg/dL).   Assessment: 66 y/o F on heparin for PE, initial heparin level is therapeutic  Goal of Therapy:  Heparin level 0.3-0.7 units/ml Monitor platelets by anticoagulation protocol: Yes   Plan:  -Cont heparin at 1000 units/hr -1000 HL  Abran DukeLedford, Leylah Tarnow 03/06/2016,3:07 AM

## 2016-03-06 NOTE — Progress Notes (Signed)
ANTICOAGULATION CONSULT NOTE - Follow Up Consult  Pharmacy Consult for Heparin -> Xarelto Indication: pulmonary embolus  Allergies  Allergen Reactions  . Asa [Aspirin] Other (See Comments)    States "the Dr told me not to take it because of my hernia."    Patient Measurements: Height: 5\' 1"  (154.9 cm) Weight: 158 lb (71.7 kg) IBW/kg (Calculated) : 47.8 Heparin Dosing Weight: 62 kg  Vital Signs: Temp: 97.8 F (36.6 C) (03/02 0452) Temp Source: Oral (03/02 0452) BP: 155/68 (03/02 0452) Pulse Rate: 63 (03/02 0452)  Labs:  Recent Labs  03/05/16 1605 03/05/16 1859 03/06/16 0158 03/06/16 0550  HGB 12.0  --  11.3* 11.8*  HCT 35.8*  --  34.9* 36.5  PLT 204  --  196 188  LABPROT 13.7  --   --   --   INR 1.05  --   --   --   HEPARINUNFRC  --   --  0.57  --   CREATININE 1.14*  --  0.92  --   TROPONINI <0.03 <0.03 <0.03 <0.03    Estimated Creatinine Clearance: 53.8 mL/min (by C-G formula based on SCr of 0.92 mg/dL).  Assessment:  [age] yof presented to the ED on 03/05/16 with CP. D-dimer elevated and found to have a PE without right heart strain.  IV heparin initiated last night, first heparin level was therapeutic (0.57) on 1000 units/hr.  To transition to Xarelto this morning.  Goal of Therapy:  Heparin level 0.3-0.7 units/ml treatment dose Xarelto Monitor platelets by anticoagulation protocol: Yes   Plan:   Xarelto 15 mg BID x 21 days, then 20 mg daily with supper.  Stop IV heparin when giving first Xarelto dose.  Xarelto education prior to discharge.  Anticipate Xarelto starter pack to be prescribed at discharge.  Dennie FettersEgan, Shontia Gillooly Donovan, RPh Pager: [phone] 03/06/2016,8:40 AM

## 2016-03-06 NOTE — Care Management Note (Addendum)
Case Management Note  Patient Details  Name: Pamela Olson MRN: [id number] Date of Birth: 26-Mar-1950  Subjective/Objective: Pt presented for chest pain and sob. Pt is from home with children/grandchildren. PTA independent and still working. Treating for PE-benefits check in process for Xarelto. CM will provide pt with cost once completed.                    Action/Plan: CM will assess for additional needs as well.   Expected Discharge Date:                  Expected Discharge Plan:  Home/Self Care  In-House Referral:  NA  Discharge planning Services  CM Consult, Medication Assistance  Post Acute Care Choice:    N/A Choice offered to:   N/A  DME Arranged:   N/A DME Agency:   N/A  HH Arranged:   N/A HH Agency:   N/A  Status of Service:  COMPLETED If discussed at Long Length of Stay Meetings, dates discussed:    Additional  Comments: 1125 03-06-16 Benefits Check completed: CM will make pt aware of cost and provide pt with 30 day free card. Per pt she will not be able to afford-she works part time to pay for insurance.  CM will provide pt with patient assistance form for her to fax to company. MD has completed forms. If company does not approve- pt is aware to contact her PCP for samples. Pt uses her neighborhood [place] on [address]-does not have medication. Pt will need a Rx for the Xarelto Starter pack and the 30 day free card to cover initial cost. CM did call [facility] to make sure medication is available and it is. Address placed on AVS to pick up Xarelto Starter Pack.  No further home needs identified at this time.   S/W  MONIQUE @ [employer] RX # [phone]   XARELTO  15 MG BID/ 20 mg thereafter   COVER- YES  CO-PAY- 100 % OF TOTAL COAST  TIER- 3 DRUG  PRIOR APPROVAL- NO   DEDUCTIBLE NOT MET   RIVAROXABAN- NOT ON FORMULARY   PHARMACY : ANY RETAIL     [name], RN 03/06/2016, 10:20 AM

## 2016-03-06 NOTE — Progress Notes (Signed)
NCM spoke to attending and pt scheduled for dc on 03/07/2016. Made aware of ABN. NCM explained Advance Beneficiary of Noncoverage. Pt verbalized understanding of ABN and states she will contact her agent to see if needs to switch back [employer]. Isidoro DonningAlesia Leigh Kaeding RN CCM Case Mgmt phone [phone]

## 2016-03-06 NOTE — Discharge Instructions (Addendum)
Follow with Primary MD JOBE,DANIEL B., MD in 7 days. Get future xaralto prescriptions by your PCP next visit.   Get CBC, CMP, 2 view Chest X ray checked  by Primary MD or SNF MD in 5-7 days ( we routinely change or add medications that can affect your baseline labs and fluid status, therefore we recommend that you get the mentioned basic workup next visit with your PCP, your PCP may decide not to get them or add new tests based on their clinical decision)  Activity: As tolerated with Full fall precautions use walker/cane & assistance as needed  Disposition Home   Diet:  Heart Healthy    For Heart failure patients - Check your Weight same time everyday, if you gain over 2 pounds, or you develop in leg swelling, experience more shortness of breath or chest pain, call your Primary MD immediately. Follow Cardiac Low Salt Diet and 1.5 lit/day fluid restriction.  On your next visit with your primary care physician please Get Medicines reviewed and adjusted.  Please request your Prim.MD to go over all Hospital Tests and Procedure/Radiological results at the follow up, please get all Hospital records sent to your Prim MD by signing hospital release before you go home.  If you experience worsening of your admission symptoms, develop shortness of breath, life threatening emergency, suicidal or homicidal thoughts you must seek medical attention immediately by calling 911 or calling your MD immediately  if symptoms less severe.  You Must read complete instructions/literature along with all the possible adverse reactions/side effects for all the Medicines you take and that have been prescribed to you. Take any new Medicines after you have completely understood and accpet all the possible adverse reactions/side effects.   Do not drive, operate heavy machinery, perform activities at heights, swimming or participation in water activities or provide baby sitting services if your were admitted for syncope or  siezures until you have seen by Primary MD or a Neurologist and advised to do so again.  Do not drive when taking Pain medications.    Do not take more than prescribed Pain, Sleep and Anxiety Medications  Special Instructions: If you have smoked or chewed Tobacco  in the last 2 yrs please stop smoking, stop any regular Alcohol  and or any Recreational drug use.  Wear Seat belts while driving.   Please note  You were cared for by a hospitalist during your hospital stay. If you have any questions about your discharge medications or the care you received while you were in the hospital after you are discharged, you can call the unit and asked to speak with the hospitalist on call if the hospitalist that took care of you is not available. Once you are discharged, your primary care physician will handle any further medical issues. Please note that NO REFILLS for any discharge medications will be authorized once you are discharged, as it is imperative that you return to your primary care physician (or establish a relationship with a primary care physician if you do not have one) for your aftercare needs so that they can reassess your need for medications and monitor your lab values.             Information on my medicine - XARELTO (rivaroxaban)  This medication education was reviewed with me or my healthcare representative as part of my discharge preparation.  The pharmacist that spoke with me during my hospital stay was:  Dennie Fettersgan, Theresa Donovan, [facility] CenterRPH  WHY WAS Carlena HurlXARELTO PRESCRIBED  FOR YOU? Xarelto was prescribed to treat blood clots that may have been found in the veins of your legs (deep vein thrombosis) or in your lungs (pulmonary embolism) and to reduce the risk of them occurring again.  What do you need to know about Xarelto? The starting dose is one 15 mg tablet taken TWICE daily with food for the FIRST 21 DAYS then on (enter date)  03/27/16  the dose is changed to one 20 mg tablet  taken ONCE A DAY with your evening meal.  DO NOT stop taking Xarelto without talking to the health care provider who prescribed the medication.  Refill your prescription for 20 mg tablets before you run out.  After discharge, you should have regular check-up appointments with your healthcare provider that is prescribing your Xarelto.  In the future your dose may need to be changed if your kidney function changes by a significant amount.  What do you do if you miss a dose? If you are taking Xarelto TWICE DAILY and you miss a dose, take it as soon as you remember. You may take two 15 mg tablets (total 30 mg) at the same time then resume your regularly scheduled 15 mg twice daily the next day.  If you are taking Xarelto ONCE DAILY and you miss a dose, take it as soon as you remember on the same day then continue your regularly scheduled once daily regimen the next day. Do not take two doses of Xarelto at the same time.   Important Safety Information Xarelto is a blood thinner medicine that can cause bleeding. You should call your healthcare provider right away if you experience any of the following: ? Bleeding from an injury or your nose that does not stop. ? Unusual colored urine (red or dark brown) or unusual colored stools (red or black). ? Unusual bruising for unknown reasons. ? A serious fall or if you hit your head (even if there is no bleeding).  Some medicines may interact with Xarelto and might increase your risk of bleeding while on Xarelto. To help avoid this, consult your healthcare provider or pharmacist prior to using any new prescription or non-prescription medications, including herbals, vitamins, non-steroidal anti-inflammatory drugs (NSAIDs) and supplements.  This website has more information on Xarelto: VisitDestination.com.br.

## 2016-03-06 NOTE — Progress Notes (Signed)
**  Preliminary report by tech**  Bilateral lower extremity venous duplex completed. There is no evidence of deep or superficial vein thrombosis involving the right and left lower extremities. All visualized vessels appear patent and compressible. There is no evidence of Baker's cysts bilaterally.  03/06/16 1:17 PM Olen CordialGreg Jumar Greenstreet RVT

## 2016-03-07 ENCOUNTER — Encounter (HOSPITAL_COMMUNITY): Payer: Self-pay | Admitting: *Deleted

## 2016-03-07 DIAGNOSIS — I2782 Chronic pulmonary embolism: Secondary | ICD-10-CM

## 2016-03-07 MED ORDER — RIVAROXABAN 15 MG PO TABS: 15.0000 mg | ORAL_TABLET | Freq: Two times a day (BID) | ORAL | 0 refills | Status: DC

## 2016-03-07 NOTE — Progress Notes (Signed)
Discharge instructions reviewed with pt. Prescription given to pt. IV dc.

## 2016-03-07 NOTE — Discharge Summary (Signed)
Pamela Olson WUJ:[id number] DOB: 03/01/1950 DOA: 03/05/2016  PCP: Malka So., MD  Admit date: 03/05/2016  Discharge date: 03/07/2016  Admitted From: Home   Disposition:  Home   Recommendations for Outpatient Follow-up:   Follow up with PCP in 1-2 weeks  PCP Please obtain BMP/CBC, 2 view CXR in 1week,  (see Discharge instructions)   PCP Please follow up on the following pending results: please arrange for long-term anticoagulation patient getting [facility], also needs outpatient hematology follow-up for unprovoked PE.   Home Health: None Equipment/Devices: None  Consultations: None Discharge Condition: Stable   CODE STATUS: Full   Diet Recommendation:  Heart Healthy    Chief Complaint  Patient presents with  . Chest Pain     Brief history of present illness from the day of admission and additional interim summary    Pamela Olson is a 66 y.o. female with medical history significant of HTN, neuropathy, and GERD; who presents with complaints of acute onset of chest pain yesterday morning after waking up, workup showed PE                                                                 Hospital Course    1. Bilateral PE. Hemodynamically stable, clinically no evidence of right heart strain, echocardiogram stable, troponin negative 3. She is now symptom-free. No chest pain or shortness of breath. Lower estimate a venous duplex unremarkable, placed on xaralto, will get 21 day supply via case management. Request PCP to arrange for future supply. Also recommend one-time outpatient hematology follow-up for unprovoked PE workup. Patient states that she has been up to date on her mammograms and colonoscopies outpatient and has no history of previous blood clots or personal history of  malignancies that she knows of. She is fairly active otherwise.  2. GERD. On PPI continue.  3. Essential hypertension. On ARB continue.  4. ARF on admission. Resolved with hydration. Note she is on ARB at home. Request PCP to monitor BMP closely.    Discharge diagnosis     Principal Problem:   Pulmonary embolus (HCC) Active Problems:   Left leg pain   Chest pain   Neuropathy (HCC)   Renal insufficiency   GERD (gastroesophageal reflux disease)    Discharge instructions    Discharge Instructions    Diet - low sodium heart healthy    Complete by:  As directed    Discharge instructions    Complete by:  As directed    Follow with Primary MD JOBE,DANIEL B., MD in 7 days . Get future xaralto prescriptions by your PCP next visit.  Get CBC, CMP, 2 view Chest X ray checked  by Primary MD in 5-7 days ( we routinely change or add medications that can affect  your baseline labs and fluid status, therefore we recommend that you get the mentioned basic workup next visit with your PCP, your PCP may decide not to get them or add new tests based on their clinical decision)  Activity: As tolerated with Full fall precautions use walker/cane & assistance as needed  Disposition Home   Diet:  Heart Healthy    For Heart failure patients - Check your Weight same time everyday, if you gain over 2 pounds, or you develop in leg swelling, experience more shortness of breath or chest pain, call your Primary MD immediately. Follow Cardiac Low Salt Diet and 1.5 lit/day fluid restriction.  On your next visit with your primary care physician please Get Medicines reviewed and adjusted.  Please request your Prim.MD to go over all Hospital Tests and Procedure/Radiological results at the follow up, please get all Hospital records sent to your Prim MD by signing hospital release before you go home.  If you experience worsening of your admission symptoms, develop shortness of breath, life threatening  emergency, suicidal or homicidal thoughts you must seek medical attention immediately by calling 911 or calling your MD immediately  if symptoms less severe.  You Must read complete instructions/literature along with all the possible adverse reactions/side effects for all the Medicines you take and that have been prescribed to you. Take any new Medicines after you have completely understood and accpet all the possible adverse reactions/side effects.   Do not drive, operate heavy machinery, perform activities at heights, swimming or participation in water activities or provide baby sitting services if your were admitted for syncope or siezures until you have seen by Primary MD or a Neurologist and advised to do so again.  Do not drive when taking Pain medications.    Do not take more than prescribed Pain, Sleep and Anxiety Medications  Special Instructions: If you have smoked or chewed Tobacco  in the last 2 yrs please stop smoking, stop any regular Alcohol  and or any Recreational drug use.  Wear Seat belts while driving.   Please note  You were cared for by a hospitalist during your hospital stay. If you have any questions about your discharge medications or the care you received while you were in the hospital after you are discharged, you can call the unit and asked to speak with the hospitalist on call if the hospitalist that took care of you is not available. Once you are discharged, your primary care physician will handle any further medical issues. Please note that NO REFILLS for any discharge medications will be authorized once you are discharged, as it is imperative that you return to your primary care physician (or establish a relationship with a primary care physician if you do not have one) for your aftercare needs so that they can reassess your need for medications and monitor your lab values.   Increase activity slowly    Complete by:  As directed       Discharge Medications    Allergies as of 03/07/2016      Reactions   Asa [aspirin] Other (See Comments)   GI sx's States "the Dr told me not to take it because of my hernia" and acid reflux      Medication List    TAKE these medications   gabapentin 300 MG capsule Commonly known as:  NEURONTIN Take 300 mg by mouth 3 (three) times daily.   losartan 100 MG tablet Commonly known as:  COZAAR Take 100 mg by  mouth daily.   omeprazole 40 MG capsule Commonly known as:  PRILOSEC Take 40 mg by mouth daily.   Rivaroxaban 15 MG Tabs tablet Commonly known as:  XARELTO Take 1 tablet (15 mg total) by mouth 2 (two) times daily with a meal. Note patient to get future prescriptions through PCP   solifenacin 10 MG tablet Commonly known as:  VESICARE Take 10 mg by mouth daily as needed for bladder spasms.   traZODone 50 MG tablet Commonly known as:  DESYREL Take 50 mg by mouth at bedtime as needed for sleep.       Follow-up Information    Go to [facility].   Why:  Pharmacy to pick up Xarelto Starter Pack.  Contact information: [address] [place], [place] [address] Hours: Open 24 hours Phone: [phone]       Synetta Fail B., MD. Nyra Capes on 03/12/2016.   Specialty:  Internal Medicine Why:  Hospital follow up @ 3.30, get future xaralto refills Contact information: [address] [place] [address] [place] [address] [phone]        Levert Feinstein, MD. Schedule an appointment as soon as possible for a visit in 1 month(s).   Specialty:  Oncology Why:  Unprovoked PE Contact information: [address] [place] [address] [phone]           Major procedures and Radiology Reports - PLEASE review detailed and final reports thoroughly  -      Lower extremity venous duplex. No DVT or SVT.  Echo gram - Left ventricle: The cavity size was normal. Wall thickness was increased in a pattern of mild LVH. Systolic function was normal. The estimated ejection fraction was in the range  of 60% to 65%. Wall motion was normal; there were no regional wall motion abnormalities. Doppler parameters are consistent with abnormal left ventricular relaxation (grade 1 diastolic dysfunction). - Mitral valve: Calcified annulus.  Impressions:  Normal LV systolic function; grade 1 diastolic dysfunction; mild LVH.   Dg Chest 2 View  Result Date: 03/05/2016 CLINICAL DATA:  Chest pain, dizziness, shortness of breath. History of hypertension, former smoker. EXAM: CHEST  2 VIEW COMPARISON:  None in PACs FINDINGS: The lungs are adequately inflated. There is subtle increased density in the right upper lobe. The cardiac silhouette is top-normal in size. The pulmonary vascularity is normal. There is no pleural effusion or pneumothorax. The mediastinum is normal in width. The bony thorax exhibits no acute abnormality. IMPRESSION: Subtle increased interstitial density in the right upper lobe which may reflect subsegmental atelectasis or early pneumonia. A pulmonary mass is felt less likely. Followup PA and lateral chest X-ray is recommended in 3-4 weeks following trial of antibiotic therapy to ensure resolution and exclude underlying malignancy. Electronically Signed   By: David  [place] M.D.   On: 03/05/2016 16:45   Ct Angio Chest Pe W And/or Wo Contrast  Result Date: 03/05/2016 CLINICAL DATA:  Right chest pain radiating to the left. EXAM: CT ANGIOGRAPHY CHEST WITH CONTRAST TECHNIQUE: Multidetector CT imaging of the chest was performed using the standard protocol during bolus administration of intravenous contrast. Multiplanar CT image reconstructions and MIPs were obtained to evaluate the vascular anatomy. CONTRAST:  100 cc Isovue 370 IV COMPARISON:  Chest x-ray performed today. FINDINGS: Cardiovascular: Filling defects are noted in right lower lobe and right upper lobe pulmonary artery is compatible with small to medium sized pulmonary emboli. Filling defects also noted in the left lower lobe pulmonary arterial  branches.  No evidence of right heart strain. Heart is normal size. Aorta is normal caliber. Mediastinum/Nodes: No mediastinal, hilar, or axillary adenopathy. Lungs/Pleura: Lungs are clear. No focal airspace opacities or suspicious nodules. No effusions. Previously seen density projecting over the right upper lobe on chest x-ray likely reflected overlapping shadows or bony structures. Upper Abdomen: Imaging into the upper abdomen shows no acute findings. Prior cholecystectomy Musculoskeletal: Chest wall soft tissues are unremarkable. No acute bony abnormality. Review of the MIP images confirms the above findings. IMPRESSION: Filling defects in the pulmonary artery is in the right upper lobe, right lower lobe and left lower lobe compatible with small to medium sized pulmonary emboli. No evidence of right heart strain. Critical Value/emergent results were called by telephone at the time of interpretation on 03/05/2016 at 6:33 pm to Dr. Lynden OxfordHRISTOPHER TEGELER , who verbally acknowledged these results. Electronically Signed   By: Charlett NoseKevin  Dover M.D.   On: 03/05/2016 18:34    Micro Results     No results found for this or any previous visit (from the past 240 hour(s)).  Today   Subjective    Kerin RansomSonia Barritt today has no headache,no chest abdominal pain,no new weakness tingling or numbness, feels much better wants to go home today    Objective   Blood pressure (!) 145/65, pulse (!) 56, temperature 98 F (36.7 C), temperature source Oral, resp. rate 18, height 5\' 1"  (1.549 m), weight 70.4 kg (155 lb 3.2 oz), SpO2 97 %.   Intake/Output Summary (Last 24 hours) at 03/07/16 1147 Last data filed at 03/07/16 0854  Gross per 24 hour  Intake              200 ml  Output                0 ml  Net              200 ml    Exam Awake Alert, Oriented x 3, No new F.N deficits, Normal affect [place].AT,PERRAL Supple Neck,No JVD, No cervical lymphadenopathy appriciated.  Symmetrical Chest wall movement, Good air movement  bilaterally, CTAB RRR,No Gallops,Rubs or new Murmurs, No Parasternal Heave +ve B.Sounds, Abd Soft, Non tender, No organomegaly appriciated, No rebound -guarding or rigidity. No Cyanosis, Clubbing or edema, No new Rash or bruise   Data Review   CBC w Diff: Lab Results  Component Value Date   WBC 7.6 03/06/2016   HGB 11.8 (L) 03/06/2016   HCT 36.5 03/06/2016   PLT 188 03/06/2016    CMP: Lab Results  Component Value Date   NA 139 03/06/2016   K 3.6 03/06/2016   CL 106 03/06/2016   CO2 25 03/06/2016   BUN 11 03/06/2016   CREATININE 0.92 03/06/2016  .   Total Time in preparing paper work, data evaluation and todays exam - 35 minutes  Leroy SeaSINGH,Rowe Warman K M.D on 03/07/2016 at 11:47 AM  [facility]   Office  [phone]

## 2016-04-11 ENCOUNTER — Encounter (HOSPITAL_BASED_OUTPATIENT_CLINIC_OR_DEPARTMENT_OTHER): Payer: Self-pay | Admitting: Emergency Medicine

## 2016-04-11 ENCOUNTER — Other Ambulatory Visit: Payer: Self-pay

## 2016-04-11 ENCOUNTER — Emergency Department (HOSPITAL_BASED_OUTPATIENT_CLINIC_OR_DEPARTMENT_OTHER)
Admission: EM | Admit: 2016-04-11 | Discharge: 2016-04-11 | Disposition: A | Discharge status: Home / Self Care | Payer: Medicare HMO | Attending: Emergency Medicine | Admitting: Emergency Medicine

## 2016-04-11 ENCOUNTER — Emergency Department (HOSPITAL_BASED_OUTPATIENT_CLINIC_OR_DEPARTMENT_OTHER): Payer: Medicare HMO

## 2016-04-11 DIAGNOSIS — R0789 Other chest pain: Secondary | ICD-10-CM | POA: Diagnosis not present

## 2016-04-11 DIAGNOSIS — I10 Essential (primary) hypertension: Secondary | ICD-10-CM | POA: Insufficient documentation

## 2016-04-11 DIAGNOSIS — Z7982 Long term (current) use of aspirin: Secondary | ICD-10-CM | POA: Insufficient documentation

## 2016-04-11 DIAGNOSIS — M791 Myalgia: Secondary | ICD-10-CM | POA: Diagnosis not present

## 2016-04-11 DIAGNOSIS — R5383 Other fatigue: Secondary | ICD-10-CM | POA: Diagnosis not present

## 2016-04-11 DIAGNOSIS — Z79899 Other long term (current) drug therapy: Secondary | ICD-10-CM | POA: Insufficient documentation

## 2016-04-11 DIAGNOSIS — Z87891 Personal history of nicotine dependence: Secondary | ICD-10-CM | POA: Diagnosis not present

## 2016-04-11 DIAGNOSIS — R1013 Epigastric pain: Secondary | ICD-10-CM | POA: Insufficient documentation

## 2016-04-11 LAB — CBC
HCT: 35 % — ABNORMAL LOW (ref 36.0–46.0)
HEMOGLOBIN: 11.6 g/dL — AB (ref 12.0–15.0)
MCH: 27 pg (ref 26.0–34.0)
MCHC: 33.1 g/dL (ref 30.0–36.0)
MCV: 81.4 fL (ref 78.0–100.0)
Platelets: 205 10*3/uL (ref 150–400)
RBC: 4.3 MIL/uL (ref 3.87–5.11)
RDW: 15.5 % (ref 11.5–15.5)
WBC: 6.4 10*3/uL (ref 4.0–10.5)

## 2016-04-11 LAB — TROPONIN I: Troponin I: <0.03 ng/mL (ref <0.03)

## 2016-04-11 LAB — BASIC METABOLIC PANEL
Anion gap: 6 (ref 5–15)
BUN: 22 mg/dL — ABNORMAL HIGH (ref 6–20)
CALCIUM: 8.8 mg/dL — AB (ref 8.9–10.3)
CHLORIDE: 105 mmol/L (ref 101–111)
CO2: 27 mmol/L (ref 22–32)
CREATININE: 0.79 mg/dL (ref 0.44–1.00)
GFR calc non Af Amer: >60 mL/min (ref >60)
Glucose, Bld: 85 mg/dL (ref 65–99)
Potassium: 3.7 mmol/L (ref 3.5–5.1)
Sodium: 138 mmol/L (ref 135–145)

## 2016-04-11 NOTE — ED Notes (Signed)
Pt given d/c instructions as per chart. Verbalizes understanding. No questions. 

## 2016-04-11 NOTE — ED Provider Notes (Signed)
[facility] Note   CSN: [id number] Arrival date & time: 04/11/16  1534  By signing my name below, I, Nelwyn Salisbury, attest that this documentation has been prepared under the direction and in the presence of Rolan Bucco, MD . Electronically Signed: Nelwyn Salisbury, Scribe. 04/11/2016. 4:56 PM.  History   Chief Complaint Chief Complaint  Patient presents with  . Abdominal Pain  . Chest Pain   The history is provided by the patient. No language interpreter was used.    HPI Comments:  Pamela Olson is a 66 y.o. female who presents to the Emergency Department complaining of sudden-onset, intermittent, currently resolved chest pain onset 2 hours ago. Pt describes her pain as located In the epigastrium and radiating into her back, exacerbated with palpation. Her pain lasted about 20 minutes. No mechanism of injury indicated. She reports associated fatigue and a burning pain in her legs which has been present for ~1 month. Pt states her pain feels similar to pain she experienced a month ago when she was dx with a blood clot in her lung. Denies any cough, cold symptoms, fevers, abdominal pain, vomiting, nausea, diarrhea, or SOB.    Past Medical History:  Diagnosis Date  . GERD (gastroesophageal reflux disease)   . Hypertension   . Neuropathic pain of right foot     Patient Active Problem List   Diagnosis Date Noted  . Left leg pain 03/06/2016  . Chest pain 03/06/2016  . Neuropathy (HCC) 03/06/2016  . Renal insufficiency 03/06/2016  . GERD (gastroesophageal reflux disease) 03/06/2016  . Pulmonary embolus (HCC) 03/05/2016    Past Surgical History:  Procedure Laterality Date  . HERNIA REPAIR    . ROTATOR CUFF REPAIR Right 2013    OB History    No data available       Home Medications    Prior to Admission medications   Medication Sig Start Date End Date Taking? Authorizing Provider  aspirin 81 MG chewable tablet Chew by mouth daily.   Yes Historical  Provider, MD  gabapentin (NEURONTIN) 300 MG capsule Take 300 mg by mouth 3 (three) times daily.   Yes Historical Provider, MD  losartan (COZAAR) 100 MG tablet Take 100 mg by mouth daily.   Yes Historical Provider, MD  omeprazole (PRILOSEC) 40 MG capsule Take 40 mg by mouth daily. 09/27/15 09/26/16 Yes Historical Provider, MD  Rivaroxaban (XARELTO) 15 MG TABS tablet Take 1 tablet (15 mg total) by mouth 2 (two) times daily with a meal. Note patient to get future prescriptions through PCP 03/07/16  Yes Leroy Sea, MD  solifenacin (VESICARE) 10 MG tablet Take 10 mg by mouth daily as needed for bladder spasms. 09/27/15  Yes Historical Provider, MD  traZODone (DESYREL) 50 MG tablet Take 50 mg by mouth at bedtime as needed for sleep.  09/27/15  Yes Historical Provider, MD    Family History No family history on file.  Social History Social History  Substance Use Topics  . Smoking status: Former Games developer  . Smokeless tobacco: Never Used  . Alcohol use No     Allergies   Asa [aspirin]   Review of Systems Review of Systems  Constitutional: Positive for fatigue. Negative for chills, diaphoresis and fever.  HENT: Negative for congestion, rhinorrhea and sneezing.   Eyes: Negative.   Respiratory: Negative for cough, chest tightness and shortness of breath.   Cardiovascular: Positive for chest pain. Negative for leg swelling.  Gastrointestinal: Negative for abdominal pain, blood in stool,  diarrhea, nausea and vomiting.  Genitourinary: Negative for difficulty urinating, flank pain, frequency and hematuria.  Musculoskeletal: Positive for myalgias. Negative for arthralgias and back pain.  Skin: Negative for rash.  Neurological: Negative for dizziness, speech difficulty, weakness, numbness and headaches.     Physical Exam Updated Vital Signs BP (!) 164/66 (BP Location: Right Arm)   Pulse (!) 50   Temp 98.5 F (36.9 C) (Oral)   Resp 15   Ht  (1.549 m)   Wt 160 lb (72.6 kg)   SpO2 99%    BMI 30.23 kg/m   Physical Exam  Constitutional: She is oriented to person, place, and time. She appears well-developed and well-nourished.  HENT:  Head: Normocephalic and atraumatic.  Eyes: Pupils are equal, round, and reactive to light.  Neck: Normal range of motion. Neck supple.  Cardiovascular: Normal rate, regular rhythm and normal heart sounds.   Pulmonary/Chest: Effort normal and breath sounds normal. No respiratory distress. She has no wheezes. She has no rales. She exhibits no tenderness.  Abdominal: Soft. Bowel sounds are normal. There is no tenderness. There is no rebound and no guarding.  Musculoskeletal: Normal range of motion. She exhibits no edema.  Lymphadenopathy:    She has no cervical adenopathy.  Neurological: She is alert and oriented to person, place, and time.  Skin: Skin is warm and dry. No rash noted.  Psychiatric: She has a normal mood and affect.     ED Treatments / Results  DIAGNOSTIC STUDIES:  Oxygen Saturation is 99% on RA, normal by my interpretation.    COORDINATION OF CARE:  5:22 PM Discussed treatment plan with pt at bedside which includes repeat troponin and pt agreed to plan.  Labs (all labs ordered are listed, but only abnormal results are displayed) Labs Reviewed  CBC - Abnormal; Notable for the following:       Result Value   Hemoglobin 11.6 (*)    HCT 35.0 (*)    All other components within normal limits  BASIC METABOLIC PANEL - Abnormal; Notable for the following:    BUN 22 (*)    Calcium 8.8 (*)    All other components within normal limits  TROPONIN I  TROPONIN I    EKG  EKG Interpretation  Date/Time:  Saturday April 11 2016 15:48:54 EDT Ventricular Rate:  61 PR Interval:  168 QRS Duration: 82 QT Interval:  326 QTC Calculation: 328 R Axis:   -4 Text Interpretation:  Normal sinus rhythm Moderate voltage criteria for LVH, may be normal variant Nonspecific T wave abnormality Abnormal ECG since last tracing no significant  change Confirmed by Lorain Fettes  MD, Derwood Becraft ([id number]) on 04/11/2016 4:23:49 PM       Radiology Dg Chest 2 View  Result Date: 04/11/2016 CLINICAL DATA:  Chest pain for 1 month. History of bilateral PE 1 month ago. EXAM: CHEST  2 VIEW COMPARISON:  Chest x-ray dated 03/05/2016. FINDINGS: Study is slightly hypoinspiratory with crowding of the perihilar and bibasilar bronchovascular markings. Given the low lung volumes, lungs are clear. No pleural effusion or pneumothorax seen. Heart size and mediastinal contours are stable. No acute or suspicious osseous finding. IMPRESSION: No active cardiopulmonary disease. No evidence of pneumonia or pulmonary edema. Electronically Signed   By: Bary Richard M.D.   On: 04/11/2016 16:11    Procedures Procedures (including critical care time)  Medications Ordered in ED Medications - No data to display   Initial Impression / Assessment and Plan / ED Course  I  have reviewed the triage vital signs and the nursing notes.  Pertinent labs & imaging results that were available during my care of the patient were reviewed by me and considered in my medical decision making (see chart for details).     Patient presents with epigastric pain lasted about 20 minutes. She's been pain-free since that time. She has no hypoxia or other symptoms that would be more consistent with a new pulmonary embolus. She's been taking her Xarelto consistently. She has no evidence of pneumonia. No ischemic changes on EKG. No symptoms that would be more consistent with acute coronary syndrome. She's had 2 negative troponins. She was discharged home in good condition. She is currently symptom-free. She was advised to follow-up with her PCP this week or return here if she has any worsening symptoms.  Final Clinical Impressions(s) / ED Diagnoses   Final diagnoses:  Atypical chest pain    New Prescriptions New Prescriptions   No medications on file  I personally performed the services described in  this documentation, which was scribed in my presence.  The recorded information has been reviewed and considered.     Rolan Bucco, MD 04/11/16 [phone]

## 2016-04-11 NOTE — ED Triage Notes (Signed)
Pt c/o upper mid epigastric pain/lower sternal chest pains with sudden onset at approx 1500 today.  Pt states pain feels similar to pain she had on March 8th of this year when she was dx'd with blood clot in her lung.  Pt denies any SOB at present.  In NAD at this time.

## 2016-04-11 NOTE — ED Notes (Signed)
Dr. Belfi in room with pt now. 

## 2016-06-12 ENCOUNTER — Emergency Department (HOSPITAL_BASED_OUTPATIENT_CLINIC_OR_DEPARTMENT_OTHER)
Admission: EM | Admit: 2016-06-12 | Discharge: 2016-06-12 | Disposition: A | Discharge status: Home / Self Care | Payer: Medicare HMO | Attending: Emergency Medicine | Admitting: Emergency Medicine

## 2016-06-12 ENCOUNTER — Encounter (HOSPITAL_BASED_OUTPATIENT_CLINIC_OR_DEPARTMENT_OTHER): Payer: Self-pay | Admitting: Emergency Medicine

## 2016-06-12 ENCOUNTER — Emergency Department (HOSPITAL_BASED_OUTPATIENT_CLINIC_OR_DEPARTMENT_OTHER): Payer: Medicare HMO

## 2016-06-12 ENCOUNTER — Other Ambulatory Visit: Payer: Self-pay

## 2016-06-12 DIAGNOSIS — I10 Essential (primary) hypertension: Secondary | ICD-10-CM | POA: Insufficient documentation

## 2016-06-12 DIAGNOSIS — Z87891 Personal history of nicotine dependence: Secondary | ICD-10-CM | POA: Insufficient documentation

## 2016-06-12 DIAGNOSIS — M545 Low back pain, unspecified: Secondary | ICD-10-CM

## 2016-06-12 DIAGNOSIS — Z79899 Other long term (current) drug therapy: Secondary | ICD-10-CM | POA: Diagnosis not present

## 2016-06-12 DIAGNOSIS — R2243 Localized swelling, mass and lump, lower limb, bilateral: Secondary | ICD-10-CM | POA: Diagnosis not present

## 2016-06-12 DIAGNOSIS — R0602 Shortness of breath: Secondary | ICD-10-CM | POA: Diagnosis present

## 2016-06-12 DIAGNOSIS — M79604 Pain in right leg: Secondary | ICD-10-CM

## 2016-06-12 DIAGNOSIS — M79605 Pain in left leg: Secondary | ICD-10-CM

## 2016-06-12 DIAGNOSIS — R079 Chest pain, unspecified: Secondary | ICD-10-CM

## 2016-06-12 DIAGNOSIS — R0789 Other chest pain: Secondary | ICD-10-CM | POA: Insufficient documentation

## 2016-06-12 HISTORY — DX: Other pulmonary embolism without acute cor pulmonale: I26.99

## 2016-06-12 LAB — URINALYSIS, COMPLETE (UACMP) WITH MICROSCOPIC
Bilirubin Urine: NEGATIVE
GLUCOSE, UA: NEGATIVE mg/dL
HGB URINE DIPSTICK: NEGATIVE
Ketones, ur: NEGATIVE mg/dL
Leukocytes, UA: NEGATIVE
Nitrite: NEGATIVE
PH: 7 (ref 5.0–8.0)
PROTEIN: NEGATIVE mg/dL
RBC / HPF: NONE SEEN RBC/hpf (ref 0–5)

## 2016-06-12 LAB — CBC WITH DIFFERENTIAL/PLATELET
BASOS ABS: 0 10*3/uL (ref 0.0–0.1)
Basophils Relative: 0 %
EOS PCT: 3 %
Eosinophils Absolute: 0.2 10*3/uL (ref 0.0–0.7)
HCT: 32 % — ABNORMAL LOW (ref 36.0–46.0)
Hemoglobin: 10.3 g/dL — ABNORMAL LOW (ref 12.0–15.0)
LYMPHS ABS: 3.5 10*3/uL (ref 0.7–4.0)
LYMPHS PCT: 50 %
MCH: 27.2 pg (ref 26.0–34.0)
MCHC: 32.2 g/dL (ref 30.0–36.0)
MCV: 84.4 fL (ref 78.0–100.0)
MONO ABS: 1.1 10*3/uL — AB (ref 0.1–1.0)
MONOS PCT: 16 %
Neutro Abs: 2.2 10*3/uL (ref 1.7–7.7)
Neutrophils Relative %: 31 %
PLATELETS: 296 10*3/uL (ref 150–400)
RBC: 3.79 MIL/uL — ABNORMAL LOW (ref 3.87–5.11)
RDW: 14.8 % (ref 11.5–15.5)
WBC: 7.1 10*3/uL (ref 4.0–10.5)

## 2016-06-12 LAB — COMPREHENSIVE METABOLIC PANEL
ALBUMIN: 4.1 g/dL (ref 3.5–5.0)
ALT: 12 U/L — ABNORMAL LOW (ref 14–54)
AST: 21 U/L (ref 15–41)
Alkaline Phosphatase: 96 U/L (ref 38–126)
Anion gap: 6 (ref 5–15)
BUN: 14 mg/dL (ref 6–20)
CO2: 28 mmol/L (ref 22–32)
Calcium: 9.1 mg/dL (ref 8.9–10.3)
Chloride: 104 mmol/L (ref 101–111)
Creatinine, Ser: 1.05 mg/dL — ABNORMAL HIGH (ref 0.44–1.00)
GFR calc Af Amer: >60 mL/min (ref >60)
GFR calc non Af Amer: 54 mL/min — ABNORMAL LOW (ref >60)
GLUCOSE: 95 mg/dL (ref 65–99)
POTASSIUM: 3.8 mmol/L (ref 3.5–5.1)
Sodium: 138 mmol/L (ref 135–145)
TOTAL PROTEIN: 8.1 g/dL (ref 6.5–8.1)
Total Bilirubin: 0.1 mg/dL — ABNORMAL LOW (ref 0.3–1.2)

## 2016-06-12 LAB — D-DIMER, QUANTITATIVE: D-Dimer, Quant: <0.27 ug/mL-FEU (ref 0.00–0.50)

## 2016-06-12 LAB — TROPONIN I

## 2016-06-12 LAB — PROTIME-INR
INR: 1.56
Prothrombin Time: 18.8 seconds — ABNORMAL HIGH (ref 11.4–15.2)

## 2016-06-12 MED ORDER — CYCLOBENZAPRINE HCL 10 MG PO TABS: 10.0000 mg | ORAL_TABLET | Freq: Two times a day (BID) | ORAL | 0 refills | Status: DC | PRN

## 2016-06-12 MED ORDER — IOPAMIDOL (ISOVUE-370) INJECTION 76%
100.0000 mL | Freq: Once | INTRAVENOUS | Status: AC | PRN
  Administered 2016-06-12: 100 mL via INTRAVENOUS

## 2016-06-12 NOTE — ED Notes (Signed)
Pt remains in radiology 

## 2016-06-12 NOTE — Discharge Instructions (Signed)
Your workup today did not show evidence of infection, or evidence of blood clots in legs or lungs. Your low back pain and leg pain are likely musculoskeletal. Please take the muscle relaxant to help with your symptoms. Please schedule a follow-up appointment with your PCP. If any symptoms change or worsen, please return to the nearest emergency department.

## 2016-06-12 NOTE — ED Notes (Addendum)
Pt on monitor 

## 2016-06-12 NOTE — ED Triage Notes (Addendum)
Reports left sided lower back pain without injury.  Reports she also has pain in the left breast into the chest.  States that she is having similar pain at present as to when she had a previous PE in March.  Reports shortness of breath, right lower leg swelling and pain.

## 2016-06-12 NOTE — ED Notes (Signed)
Pt will trying given a urine sample around 9pm

## 2016-06-12 NOTE — ED Notes (Signed)
Pt ambulated to restroom without difficulty

## 2016-06-12 NOTE — ED Notes (Signed)
Patient transported to Ultrasound 

## 2016-06-12 NOTE — ED Notes (Signed)
Pt verbalizes understanding of d/c instructions and denies any further needs at this time. 

## 2016-06-12 NOTE — ED Provider Notes (Signed)
[facility] Provider Note   CSN: [id number] Arrival date & time: 06/12/16  1732  By signing my name below, I, Pamela Olson, attest that this documentation has been prepared under the direction and in the presence of Tegeler, Canary Brim, MD.  Electronically Signed: Cynda Olson, Scribe. 06/12/16. 6:12 PM.  History   Chief Complaint Chief Complaint  Patient presents with  . Shortness of Breath  . Leg Swelling  . Back Pain    HPI Comments: Pamela Olson is a 66 y.o. female with a history of PE, HTN, and GERD, who presents to the Emergency Department complaining of sudden-onset, constant pain to the bilateral legs. Patient states she has developed a 7/10 painful "burning" sensation in her bilateral legs. Patient was diagnosed with a PE one month ago, in which she was started on xarelto. Patient's xarelto was stopped for three weeks due to profuse bleeding in breast biopsy, her xarelto was restarted one week ago. Patient states during the time she was off of xarelto is when she developed bilateral leg pain.Patient reports associated right leg swelling, lower back pain, chest pain (resolved), and shortness of breath on exertion. No modifying factors indicated. Patient  Patient denies any fever, chills, dysuria, melena, palpations, syncope, abdominal pain, nausea, or vomiting.   The history is provided by the patient. No language interpreter was used.  Leg Pain   This is a new problem. The current episode started more than 1 week ago. The problem occurs constantly. The problem has been gradually worsening. The pain is present in the right lower leg and left lower leg. Quality: Burning. The pain is at a severity of 7/10. The pain is mild. Pertinent negatives include no numbness, full range of motion, no stiffness, no tingling and no itching. She has tried nothing for the symptoms. There has been no history of extremity trauma. Family history is significant for no rheumatoid arthritis  and no gout.  Chest Pain   This is a new problem. The current episode started more than 1 week ago. The problem occurs constantly. The problem has not changed since onset.The pain is present in the substernal region. The pain is at a severity of 7/10. The pain is mild. The quality of the pain is described as pressure-like. The pain does not radiate. Associated symptoms include back pain, leg pain and shortness of breath. Pertinent negatives include no abdominal pain, no cough, no diaphoresis, no fever, no headaches, no nausea, no numbness, no palpitations and no vomiting. She has tried nothing for the symptoms.  Her past medical history is significant for PE.    Past Medical History:  Diagnosis Date  . GERD (gastroesophageal reflux disease)   . Hypertension   . Neuropathic pain of right foot   . Pulmonary emboli [facility])     Patient Active Problem List   Diagnosis Date Noted  . Left leg pain 03/06/2016  . Chest pain 03/06/2016  . Neuropathy 03/06/2016  . Renal insufficiency 03/06/2016  . GERD (gastroesophageal reflux disease) 03/06/2016  . Pulmonary embolus (HCC) 03/05/2016    Past Surgical History:  Procedure Laterality Date  . HERNIA REPAIR    . ROTATOR CUFF REPAIR Right 2013    OB History    No data available       Home Medications    Prior to Admission medications   Medication Sig Start Date End Date Taking? Authorizing Provider  aspirin 81 MG chewable tablet Chew by mouth daily.    [provider]  gabapentin (NEURONTIN) 300 MG capsule Take 300 mg by mouth 3 (three) times daily.    [provider]  losartan (COZAAR) 100 MG tablet Take 100 mg by mouth daily.    [provider]  omeprazole (PRILOSEC) 40 MG capsule Take 40 mg by mouth daily. 09/27/15 09/26/16  [provider]  Rivaroxaban (XARELTO) 15 MG TABS tablet Take 1 tablet (15 mg total) by mouth 2 (two) times daily with a meal. Note patient to get future prescriptions through PCP  03/07/16   Leroy Sea, MD  solifenacin (VESICARE) 10 MG tablet Take 10 mg by mouth daily as needed for bladder spasms. 09/27/15   [provider]  traZODone (DESYREL) 50 MG tablet Take 50 mg by mouth at bedtime as needed for sleep.  09/27/15   [provider]    Family History History reviewed. No pertinent family history.  Social History Social History  Substance Use Topics  . Smoking status: Former Games developer  . Smokeless tobacco: Never Used  . Alcohol use No     Allergies   Asa [aspirin]   Review of Systems Review of Systems  Constitutional: Negative for chills, diaphoresis, fatigue and fever.  HENT: Negative for congestion.   Respiratory: Positive for shortness of breath. Negative for cough, choking, chest tightness, wheezing and stridor.   Cardiovascular: Positive for chest pain and leg swelling (right). Negative for palpitations.  Gastrointestinal: Negative for abdominal pain, diarrhea, nausea and vomiting.  Genitourinary: Negative for dysuria, flank pain and hematuria.  Musculoskeletal: Positive for arthralgias (bilateral leg) and back pain. Negative for neck pain, neck stiffness and stiffness.  Skin: Negative for itching, rash and wound.  Neurological: Negative for tingling, syncope, light-headedness, numbness and headaches.  Psychiatric/Behavioral: Negative for confusion.  All other systems reviewed and are negative.    Physical Exam Updated Vital Signs BP (!) 150/60 (BP Location: Left Arm)   Pulse 68   Temp 98.1 F (36.7 C) (Oral)   Resp 16   Ht 5\' 1"  (1.549 m)   Wt 157 lb (71.2 kg)   SpO2 100%   BMI 29.66 kg/m   Physical Exam  Constitutional: She is oriented to person, place, and time. She appears well-developed and well-nourished. No distress.  HENT:  Head: Normocephalic and atraumatic.  Mouth/Throat: Oropharynx is clear and moist. No oropharyngeal exudate.  Eyes: EOM are normal. Pupils are equal, round, and reactive to light.    Neck: Normal range of motion. Neck supple.  Cardiovascular: Normal rate, regular rhythm and intact distal pulses.   No murmur heard. Well healing biopsy incision on the left medial breast with generalized breast tenderness.   Pulmonary/Chest: Effort normal and breath sounds normal. No stridor. No respiratory distress. She has no wheezes. She has no rales. She exhibits no tenderness.  Abdominal: Soft. Bowel sounds are normal. She exhibits no distension. There is no tenderness.  No CVA tenderness.   Musculoskeletal: Normal range of motion. She exhibits tenderness. She exhibits no edema or deformity.  Right calf tenderness, no significant tenderness on the left. Normal strength and pulses. No significant swelling. Mild back tenderness in the lumbar paraspinal muscles No midline tenderness.   Neurological: She is alert and oriented to person, place, and time. No sensory deficit. She exhibits normal muscle tone.  Skin: Skin is warm and dry. Capillary refill takes less than 2 seconds. No rash noted. She is not diaphoretic. No erythema.  Psychiatric: She has a normal mood and affect.  Nursing note and vitals reviewed.  ED Treatments / Results  DIAGNOSTIC STUDIES: Oxygen Saturation is 100% on RA, normal by my interpretation.    COORDINATION OF CARE: 6:44 PM Discussed treatment plan with pt at bedside and pt agreed to plan, which includes lab work.   Labs (all labs ordered are listed, but only abnormal results are displayed) Labs Reviewed  COMPREHENSIVE METABOLIC PANEL - Abnormal; Notable for the following:       Result Value   Creatinine, Ser 1.05 (*)    ALT 12 (*)    Total Bilirubin 0.1 (*)    GFR calc non Af Amer 54 (*)    All other components within normal limits  CBC WITH DIFFERENTIAL/PLATELET - Abnormal; Notable for the following:    RBC 3.79 (*)    Hemoglobin 10.3 (*)    HCT 32.0 (*)    Monocytes Absolute 1.1 (*)    All other components within normal limits  PROTIME-INR -  Abnormal; Notable for the following:    Prothrombin Time 18.8 (*)    All other components within normal limits  URINALYSIS, COMPLETE (UACMP) WITH MICROSCOPIC - Abnormal; Notable for the following:    Specific Gravity, Urine >1.046 (*)    Squamous Epithelial / LPF 0-5 (*)    Bacteria, UA RARE (*)    All other components within normal limits  D-DIMER, QUANTITATIVE (NOT AT [facility])  TROPONIN I    EKG  EKG Interpretation  Date/Time:  Friday June 12 2016 17:45:07 EDT Ventricular Rate:  72 PR Interval:  154 QRS Duration: 92 QT Interval:  392 QTC Calculation: 429 R Axis:   -14 Text Interpretation:  Normal sinus rhythm Left ventricular hypertrophy Abnormal ECG When compared to prior, no significant changes seen.  No STEMI Confirmed by Theda Belfastegeler, Chris ([id number]) on 06/12/2016 6:01:59 PM       Radiology Ct Angio Chest Pe W Or Wo Contrast  Result Date: 06/12/2016 CLINICAL DATA:  Chest pain. Bilateral leg pain. Recent pulmonary embolus. EXAM: CT ANGIOGRAPHY CHEST WITH CONTRAST TECHNIQUE: Multidetector CT imaging of the chest was performed using the standard protocol during bolus administration of intravenous contrast. Multiplanar CT image reconstructions and MIPs were obtained to evaluate the vascular anatomy. CONTRAST:  100 mL Isovue 370 COMPARISON:  CTA of the chest 03/05/2016. FINDINGS: Cardiovascular: The heart size normal. Minimal atherosclerotic changes are noted at the aorta without aneurysm. Pulmonary arteries are of normal size. Pulmonary artery opacification is excellent. There is no focal filling defect. Previously seen pulmonary emboli have resolved. Mediastinum/Nodes: No significant mediastinal or axillary adenopathy is present. The thoracic inlet is within normal limits. The esophagus is unremarkable. Lungs/Pleura: The lungs are clear. At 3 mm pleural-based nodule is seen inferiorly in the posterior right lower lobe on image 56 of series 5. There was some inflammatory change at the same level  on the prior exam. The lungs are otherwise clear without other focal nodule, mass, or airspace disease. Upper Abdomen: Limited imaging of the upper abdomen is unremarkable. Musculoskeletal: Rightward curvature is present in the upper thoracic spine. Vertebral body heights and alignment are maintained. No focal lytic or blastic lesions are present. Review of the MIP images confirms the above findings. IMPRESSION: 1. No pulmonary emboli.  Previous emboli have resolved. 2. Minimal atherosclerotic changes. 3. No acute or focal abnormality to explain chest pain. 4. 3 mm inferior right lower lobe pulmonary nodule is pleural-based in appears be inflammatory. No follow-up needed if patient is low-risk. Non-contrast chest CT can be considered in 12 months if patient is  high-risk. This recommendation follows the consensus statement: Guidelines for Management of Incidental Pulmonary Nodules Detected on CT Images: From the Fleischner Society 2017; Radiology 2017; 284:228-243. 5. Scoliosis. Electronically Signed   By: Marin Roberts M.D.   On: 06/12/2016 20:03   US Venous Img Lower Bilateral  Result Date: 06/12/2016 CLINICAL DATA:  66 year old female with bilateral lower extremity pain for 3 months. History of prior DVT and PE. EXAM: BILATERAL LOWER EXTREMITY VENOUS DOPPLER ULTRASOUND TECHNIQUE: Gray-scale sonography with graded compression, as well as color Doppler and duplex ultrasound were performed to evaluate the lower extremity deep venous systems from the level of the common femoral vein and including the common femoral, femoral, profunda femoral, popliteal and calf veins including the posterior tibial, peroneal and gastrocnemius veins when visible. The superficial great saphenous vein was also interrogated. Spectral Doppler was utilized to evaluate flow at rest and with distal augmentation maneuvers in the common femoral, femoral and popliteal veins. COMPARISON:  None. FINDINGS: Deep venous system appears patent  and compressible from groin through popliteal fossae bilaterally. Spontaneous venous flow present with evidence of respiratory phasicity.Augmentation intact. No intraluminal thrombus identified. Visualized portions of the greater saphenous veins patent bilaterally. IMPRESSION: No evidence of DVT within either lower extremity. Electronically Signed   By: Harmon Pier M.D.   On: 06/12/2016 19:49    Procedures Procedures (including critical care time)  Medications Ordered in ED Medications  iopamidol (ISOVUE-370) 76 % injection 100 mL (100 mLs Intravenous Contrast Given 06/12/16 1943)     Initial Impression / Assessment and Plan / ED Course  I have reviewed the triage vital signs and the nursing notes.  Pertinent labs & imaging results that were available during my care of the patient were reviewed by me and considered in my medical decision making (see chart for details).     Pamela Olson is a 66 y.o. female with a history of PE, HTN, and GERD, who presents to the Emergency Department complaining leg pain, low back pain, and chest pain.   Patient reports stopping Xarelto for several weeks for a biopsy bleed. Patient reports pain in both legs and her chest. Also pain and low back.   History and exam are seen above. Patient has tenderness in both legs, right greater than left. Also tenderness in the low back. Chest was mildly tender to palpation. Lungs were clear.  Based on history and symptoms, patient will have ultrasound of the legs to look for DVT and CT scan to look for recurrent PE in the lungs. Lab testing also ordered.  Diagnostic testing showed no evidence of acute infection. Hemoglobin slightly decreased from prior but doubtless as cause of symptoms. D dimer negative. PT study shows resolution of embolus and no new PE. DVT studies negative. Initial troponin negative.  Given reassuring workup, do not feel patient has new blood clot or serious infection. Suspect musculoskeletal pain.  Patient given prescription for muscle relaxant and instructed to follow up with her PCP. Patient continued to have reassuring vital signs. Patient understood strict return precautions for any new or worsened symptoms. Patient voiced understanding of the plan of care and was discharged in good condition.    Final Clinical Impressions(s) / ED Diagnoses   Final diagnoses:  Chest pain  Acute bilateral low back pain without sciatica  Pain in both lower extremities    New Prescriptions Discharge Medication List as of 06/12/2016 11:00 PM    START taking these medications   Details  cyclobenzaprine (FLEXERIL) 10 MG  tablet Take 1 tablet (10 mg total) by mouth 2 (two) times daily as needed for muscle spasms., Starting Fri 06/12/2016, Print       *I personally performed the services described in this documentation, which was scribed in my presence. The recorded information has been reviewed and is accurate.  Clinical Impression: 1. Acute bilateral low back pain without sciatica   2. Chest pain   3. Pain in both lower extremities     Disposition: Discharge  Condition: Good  I have discussed the results, Dx and Tx plan with the pt(& family if present). He/she/they expressed understanding and agree(s) with the plan. Discharge instructions discussed at great length. Strict return precautions discussed and pt &/or family have verbalized understanding of the instructions. No further questions at time of discharge.    Discharge Medication List as of 06/12/2016 11:00 PM    START taking these medications   Details  cyclobenzaprine (FLEXERIL) 10 MG tablet Take 1 tablet (10 mg total) by mouth 2 (two) times daily as needed for muscle spasms., Starting Fri 06/12/2016, Print        Follow Up: Malka So., MD [address] [place] [address] [phone]  Schedule an appointment as soon as possible for a visit    [facility] [place] [facility] [address] [id number] mc [address] [place] [address] [phone]  If symptoms worsen     Tegeler, Canary Brim, MD 06/13/16 0300

## 2017-02-12 ENCOUNTER — Other Ambulatory Visit: Payer: Self-pay

## 2017-02-12 ENCOUNTER — Encounter (HOSPITAL_BASED_OUTPATIENT_CLINIC_OR_DEPARTMENT_OTHER): Payer: Self-pay | Admitting: *Deleted

## 2017-02-12 DIAGNOSIS — I129 Hypertensive chronic kidney disease with stage 1 through stage 4 chronic kidney disease, or unspecified chronic kidney disease: Secondary | ICD-10-CM | POA: Diagnosis not present

## 2017-02-12 DIAGNOSIS — Z7901 Long term (current) use of anticoagulants: Secondary | ICD-10-CM | POA: Diagnosis not present

## 2017-02-12 DIAGNOSIS — Z7982 Long term (current) use of aspirin: Secondary | ICD-10-CM | POA: Insufficient documentation

## 2017-02-12 DIAGNOSIS — R079 Chest pain, unspecified: Secondary | ICD-10-CM | POA: Diagnosis not present

## 2017-02-12 DIAGNOSIS — Z86711 Personal history of pulmonary embolism: Secondary | ICD-10-CM | POA: Insufficient documentation

## 2017-02-12 DIAGNOSIS — Z87891 Personal history of nicotine dependence: Secondary | ICD-10-CM | POA: Insufficient documentation

## 2017-02-12 DIAGNOSIS — N189 Chronic kidney disease, unspecified: Secondary | ICD-10-CM | POA: Insufficient documentation

## 2017-02-12 DIAGNOSIS — Z79899 Other long term (current) drug therapy: Secondary | ICD-10-CM | POA: Insufficient documentation

## 2017-02-12 NOTE — ED Triage Notes (Signed)
Chest pain this evening. Sharp epigastric pain. She has a hx of blood clots in both lungs in March. She was taking Xarelto until she had blood work that showed her blood to be thin. Her MD told her to stop the Xarelto x 3 days. Tonight when she started having the epigastric pain she took one of her Xarelto's.

## 2017-02-13 ENCOUNTER — Emergency Department (HOSPITAL_BASED_OUTPATIENT_CLINIC_OR_DEPARTMENT_OTHER): Payer: Medicare Other

## 2017-02-13 ENCOUNTER — Emergency Department (HOSPITAL_BASED_OUTPATIENT_CLINIC_OR_DEPARTMENT_OTHER)
Admission: EM | Admit: 2017-02-13 | Discharge: 2017-02-13 | Disposition: A | Discharge status: Home / Self Care | Payer: Medicare Other | Attending: Emergency Medicine | Admitting: Emergency Medicine

## 2017-02-13 DIAGNOSIS — R079 Chest pain, unspecified: Secondary | ICD-10-CM

## 2017-02-13 LAB — TROPONIN I

## 2017-02-13 LAB — BASIC METABOLIC PANEL
Anion gap: 6 (ref 5–15)
BUN: 15 mg/dL (ref 6–20)
CALCIUM: 8.7 mg/dL — AB (ref 8.9–10.3)
CO2: 27 mmol/L (ref 22–32)
Chloride: 104 mmol/L (ref 101–111)
Creatinine, Ser: 0.91 mg/dL (ref 0.44–1.00)
GFR calc Af Amer: >60 mL/min (ref >60)
Glucose, Bld: 106 mg/dL — ABNORMAL HIGH (ref 65–99)
Potassium: 3.8 mmol/L (ref 3.5–5.1)
SODIUM: 137 mmol/L (ref 135–145)

## 2017-02-13 LAB — CBC WITH DIFFERENTIAL/PLATELET
BASOS ABS: 0 10*3/uL (ref 0.0–0.1)
BASOS PCT: 0 %
EOS ABS: 0.2 10*3/uL (ref 0.0–0.7)
EOS PCT: 3 %
HCT: 35.1 % — ABNORMAL LOW (ref 36.0–46.0)
Hemoglobin: 11.8 g/dL — ABNORMAL LOW (ref 12.0–15.0)
LYMPHS PCT: 47 %
Lymphs Abs: 3.9 10*3/uL (ref 0.7–4.0)
MCH: 26.8 pg (ref 26.0–34.0)
MCHC: 33.6 g/dL (ref 30.0–36.0)
MCV: 79.8 fL (ref 78.0–100.0)
Monocytes Absolute: 1.2 10*3/uL — ABNORMAL HIGH (ref 0.1–1.0)
Monocytes Relative: 14 %
Neutro Abs: 2.9 10*3/uL (ref 1.7–7.7)
Neutrophils Relative %: 36 %
Platelets: 200 10*3/uL (ref 150–400)
RBC: 4.4 MIL/uL (ref 3.87–5.11)
RDW: 14.8 % (ref 11.5–15.5)
WBC: 8.2 10*3/uL (ref 4.0–10.5)

## 2017-02-13 NOTE — ED Provider Notes (Signed)
[facility] EM[facility]GENCY DEPARTMENT Provider Note   CSN: [id number] Arrival date & time: 02/12/17  2055     History   Chief Complaint Chief Complaint  Patient presents with  . Chest Pain    HPI Pamela Olson is a 67 y.o. female.  Patient is a 67 year old female with history of pulmonary embolism currently taking Xarelto.  She presents today for evaluation of chest discomfort.  Her doctor advised her to stop her Xarelto for the past 3 days as she had also been taking ibuprofen.  This morning she developed sharp pain in the front of her chest, then restarted her Xarelto.  She denies any shortness of breath.  She denies any nausea, diaphoresis, or radiation to her arm or jaw.  She has no prior cardiac history.   The history is provided by the patient.  Chest Pain   This is a new problem. The current episode started yesterday. The problem occurs constantly. The problem has not changed since onset.The pain is present in the substernal region. The pain is moderate. The quality of the pain is described as sharp. The pain does not radiate. The symptoms are aggravated by certain positions. Pertinent negatives include no cough, no diaphoresis, no nausea and no sputum production. She has tried nothing for the symptoms. The treatment provided no relief.    Past Medical History:  Diagnosis Date  . GERD (gastroesophageal reflux disease)   . Hypertension   . Neuropathic pain of right foot   . Pulmonary emboli [facility](HCC)     Patient Active Problem List   Diagnosis Date Noted  . Left leg pain 03/06/2016  . Chest pain 03/06/2016  . Neuropathy 03/06/2016  . Renal insufficiency 03/06/2016  . GERD (gastroesophageal reflux disease) 03/06/2016  . Pulmonary embolus (HCC) 03/05/2016    Past Surgical History:  Procedure Laterality Date  . HERNIA REPAIR    . ROTATOR CUFF REPAIR Right 2013    OB History    No data available       Home Medications    Prior to Admission medications     Medication Sig Start Date End Date Taking? Authorizing Provider  aspirin 81 MG chewable tablet Chew by mouth daily.    [provider]  cyclobenzaprine (FLEXERIL) 10 MG tablet Take 1 tablet (10 mg total) by mouth 2 (two) times daily as needed for muscle spasms. 06/12/16   Tegeler, Canary Brimhristopher J, MD  gabapentin (NEURONTIN) 300 MG capsule Take 300 mg by mouth 3 (three) times daily.    [provider]  losartan (COZAAR) 100 MG tablet Take 100 mg by mouth daily.    [provider]  omeprazole (PRILOSEC) 40 MG capsule Take 40 mg by mouth daily. 09/27/15 09/26/16  [provider]  Rivaroxaban (XARELTO) 15 MG TABS tablet Take 1 tablet (15 mg total) by mouth 2 (two) times daily with a meal. Note patient to get future prescriptions through PCP 03/07/16   Leroy SeaSingh, Prashant K, MD  solifenacin (VESICARE) 10 MG tablet Take 10 mg by mouth daily as needed for bladder spasms. 09/27/15   [provider]  traZODone (DESYREL) 50 MG tablet Take 50 mg by mouth at bedtime as needed for sleep.  09/27/15   [provider]    Family History No family history on file.  Social History Social History   Tobacco Use  . Smoking status: Former Games developermoker  . Smokeless tobacco: Never Used  Substance Use Topics  . Alcohol use: No  . Drug use:  No     Allergies   Asa [aspirin]   Review of Systems Review of Systems  Constitutional: Negative for diaphoresis.  Respiratory: Negative for cough and sputum production.   Cardiovascular: Positive for chest pain.  Gastrointestinal: Negative for nausea.  All other systems reviewed and are negative.    Physical Exam Updated Vital Signs BP (!) 171/85   Pulse 71   Temp 98.2 F (36.8 C) (Oral)   Resp 20   Ht 5\' 1"  (1.549 m)   Wt 69.9 kg (154 lb)   SpO2 98%   BMI 29.10 kg/m   Physical Exam  Constitutional: She is oriented to person, place, and time. She appears well-developed and well-nourished. No distress.  HENT:  Head:  Normocephalic and atraumatic.  Neck: Normal range of motion. Neck supple.  Cardiovascular: Normal rate and regular rhythm. Exam reveals no gallop and no friction rub.  No murmur heard. Pulmonary/Chest: Effort normal and breath sounds normal. No respiratory distress. She has no wheezes.  Abdominal: Soft. Bowel sounds are normal. She exhibits no distension. There is no tenderness.  Musculoskeletal: Normal range of motion.       Right lower leg: Normal. She exhibits no tenderness and no edema.       Left lower leg: Normal. She exhibits no tenderness and no edema.  Neurological: She is alert and oriented to person, place, and time.  Skin: Skin is warm and dry. She is not diaphoretic.  Nursing note and vitals reviewed.    ED Treatments / Results  Labs (all labs ordered are listed, but only abnormal results are displayed) Labs Reviewed  BASIC METABOLIC PANEL  CBC WITH DIFFERENTIAL/PLATELET  TROPONIN I    EKG  EKG Interpretation  Date/Time:  Friday February 12 2017 20:59:21 EST Ventricular Rate:  65 PR Interval:  172 QRS Duration: 84 QT Interval:  378 QTC Calculation: 393 R Axis:   -18 Text Interpretation:  Normal sinus rhythm Left ventricular hypertrophy Abnormal ECG Confirmed by Geoffery Lyons ([id number]) on 02/13/2017 2:32:19 AM       Radiology No results found.  Procedures Procedures (including critical care time)  Medications Ordered in ED Medications - No data to display   Initial Impression / Assessment and Plan / ED Course  I have reviewed the triage vital signs and the nursing notes.  Pertinent labs & imaging results that were available during my care of the patient were reviewed by me and considered in my medical decision making (see chart for details).  Patient presenting here with chest discomfort that began earlier today.  She has a history of pulmonary embolism was off of her Xarelto for 3 days.  I highly doubt a pulmonary embolism.  She has no hypoxia,  tachycardia, or tachypnea.  She has restarted her Xarelto.  Workup reveals no evidence for a cardiac etiology.  Her troponin is negative, EKG is unchanged, and remainder of laboratory studies are unremarkable.  I see no reason for further workup at this time.  She will be discharged, to return as needed if she worsens.  Final Clinical Impressions(s) / ED Diagnoses   Final diagnoses:  None    ED Discharge Orders    None       Geoffery Lyons, MD 02/13/17 [phone]

## 2017-02-13 NOTE — ED Notes (Signed)
Patient transported to X-ray 

## 2017-02-13 NOTE — Discharge Instructions (Signed)
Tylenol 1000 mg every 6 hours as needed for pain.  Follow-up with your primary doctor if not improving in the next 2-3 days, and return to the ER if symptoms significantly worsen or change in the meantime.

## 2017-07-13 IMAGING — CR DG CHEST 2V
2 series · 2 of 2 positions shown · non-contrast
Comparison: Chest x-ray dated 03/05/2016.

CLINICAL DATA: Chest pain for 1 month. History of bilateral PE 1
month ago.

EXAM:
CHEST  2 VIEW

[w chest pa]
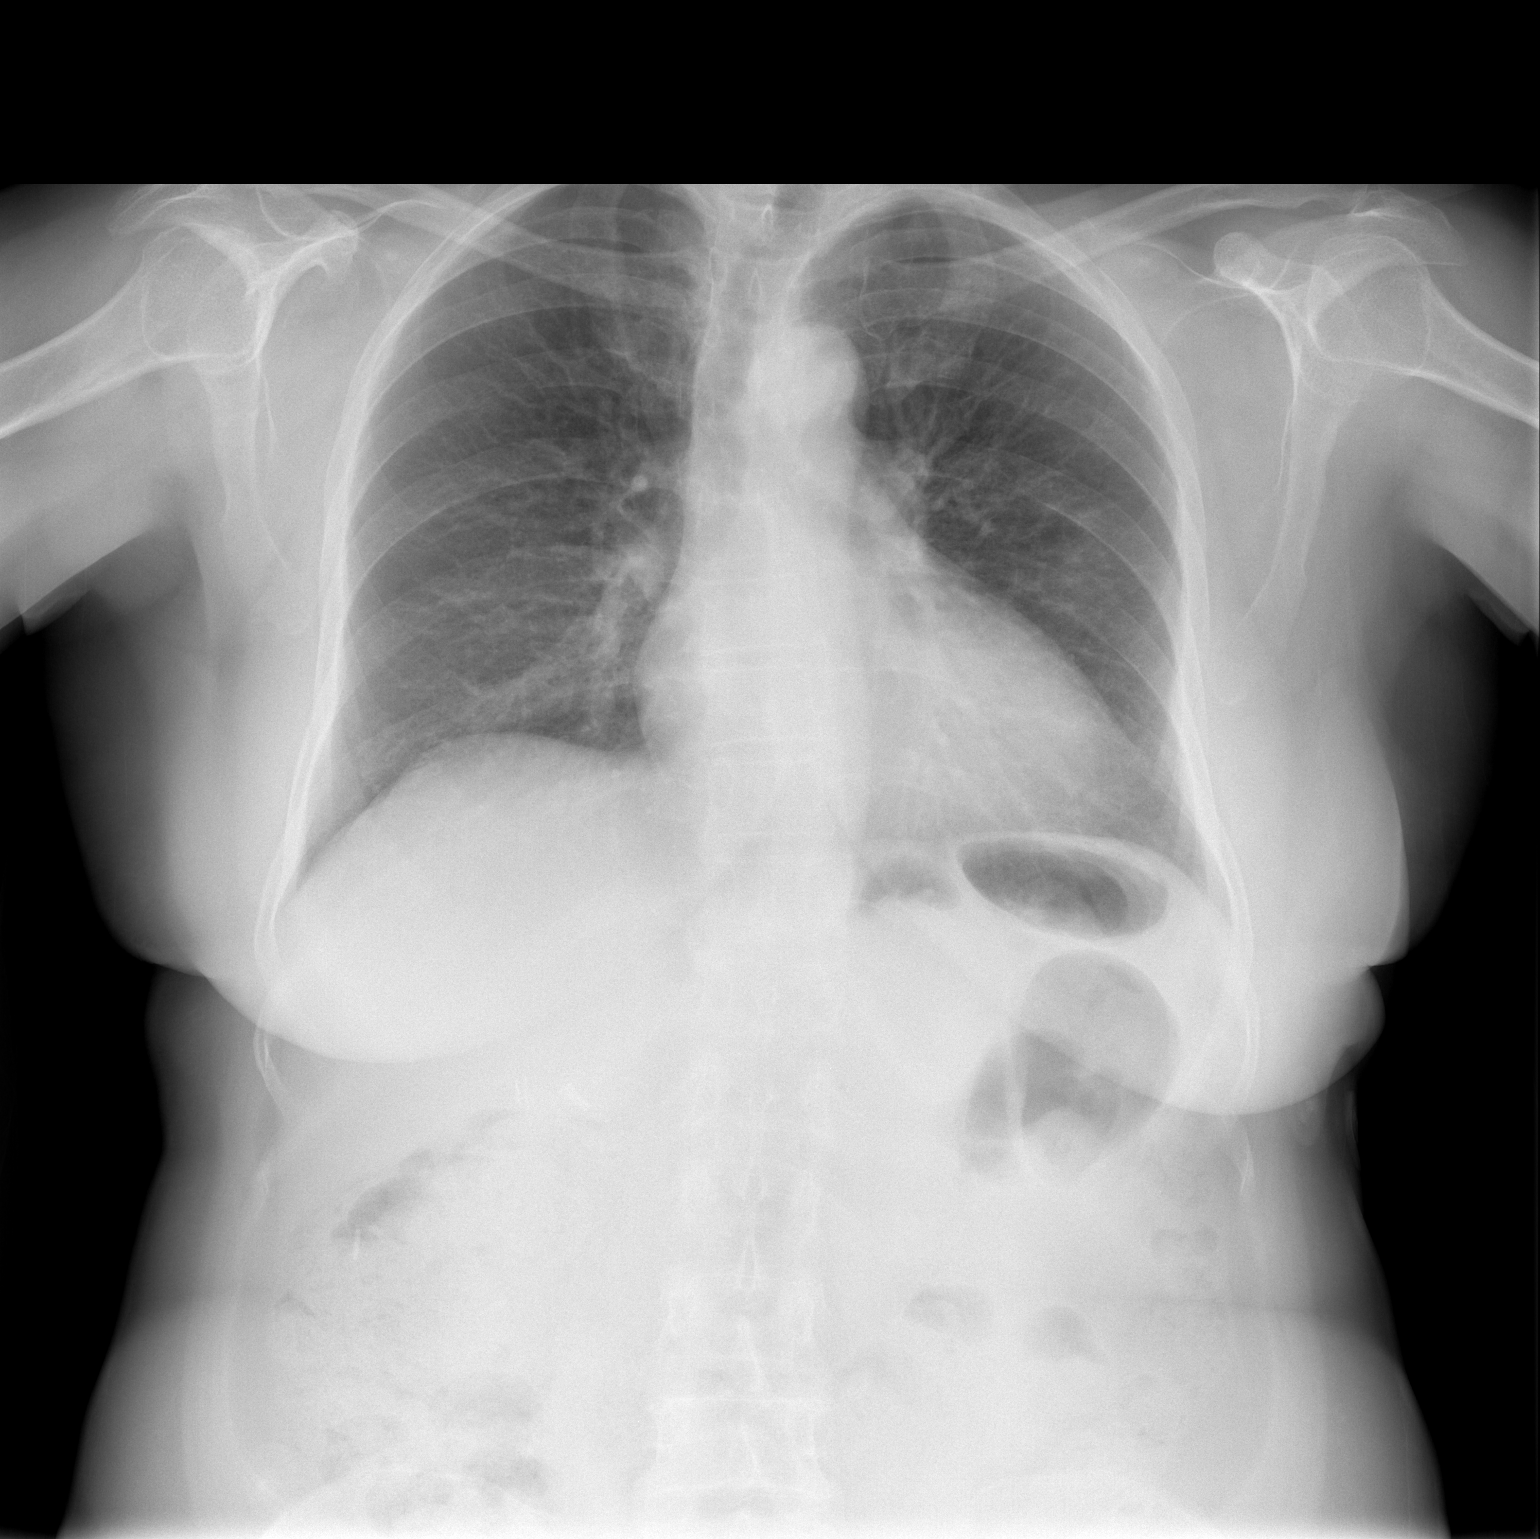

[w chest lat]
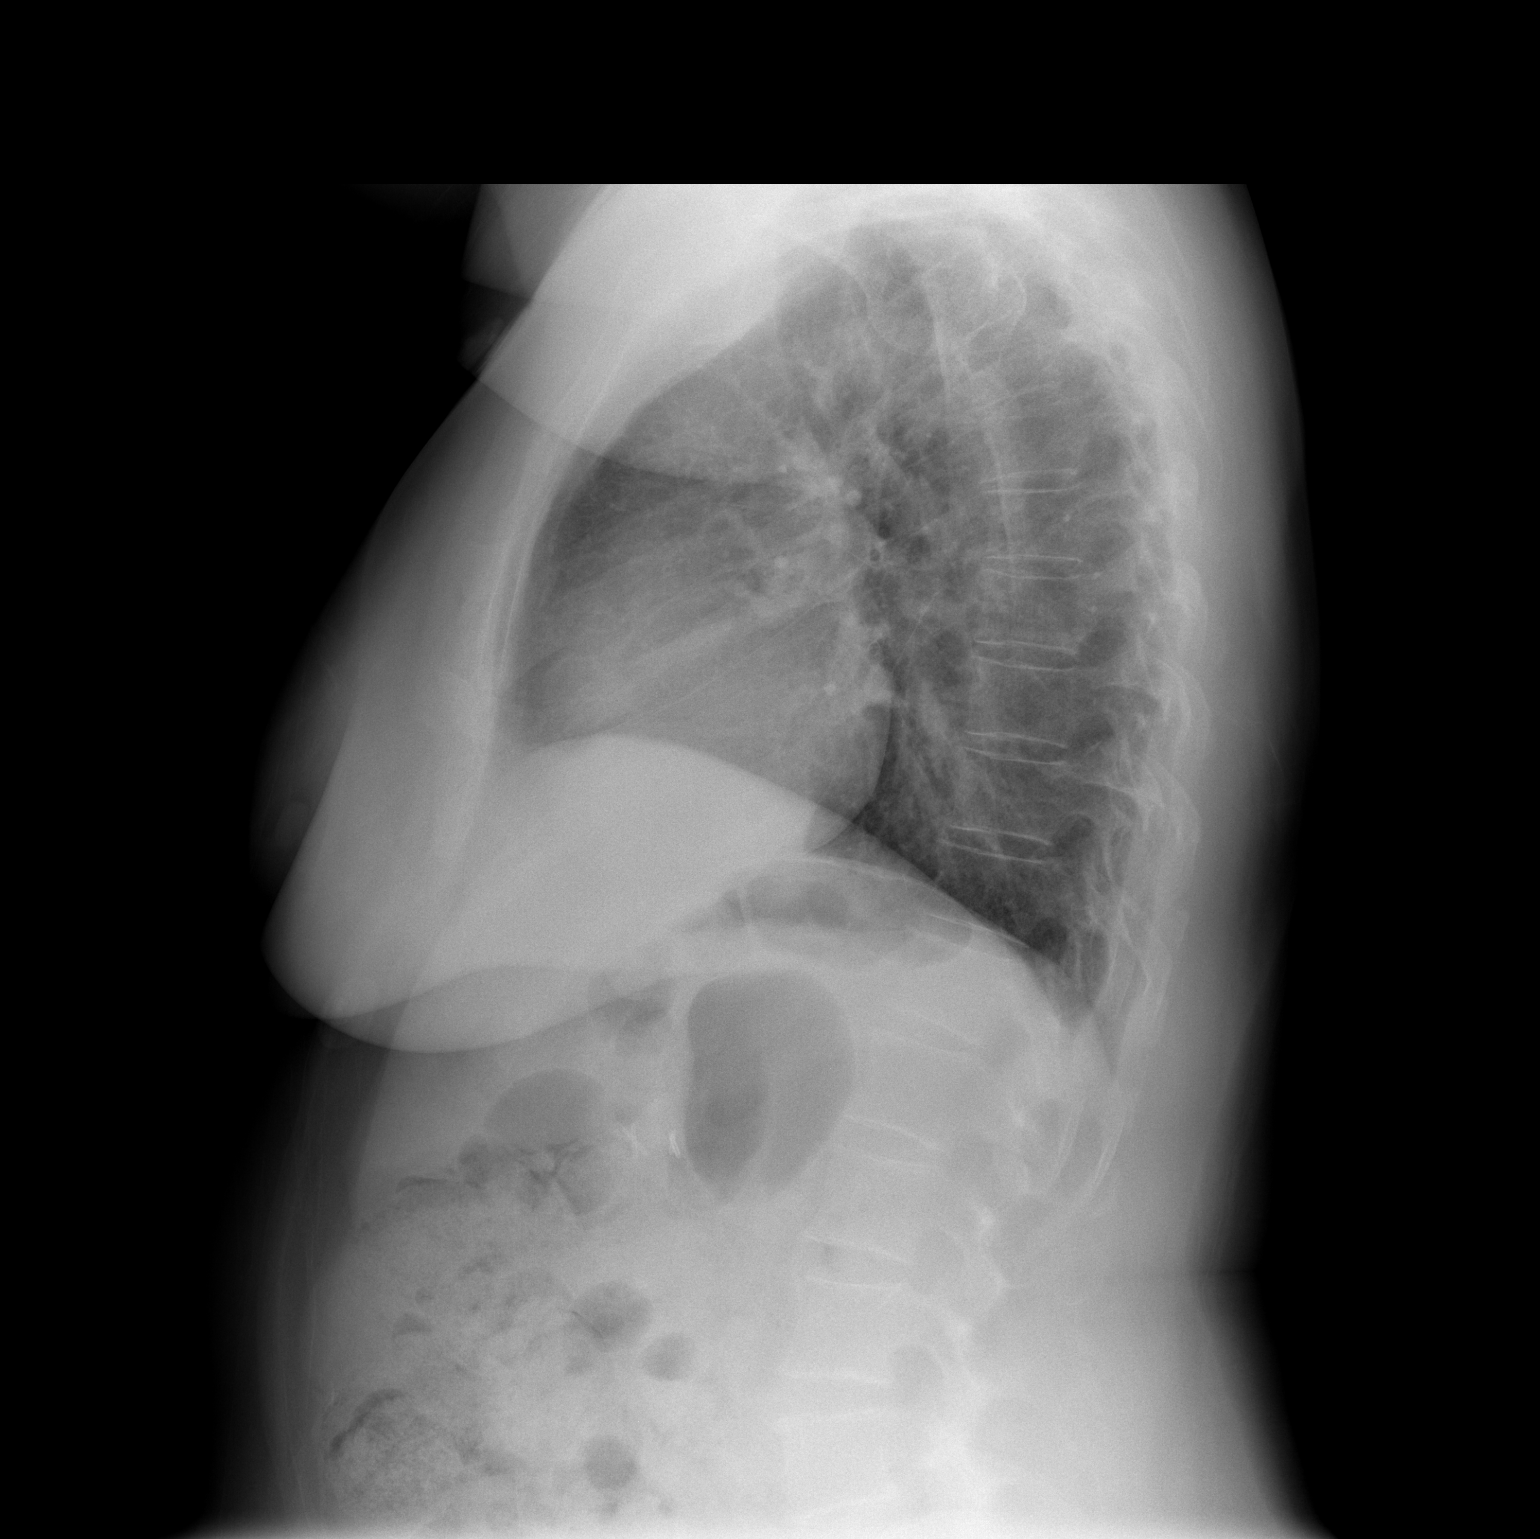

[2 of 2 positions shown; findings below may reference images not displayed]

FINDINGS: Study is slightly hypoinspiratory with crowding of the perihilar and
bibasilar bronchovascular markings. Given the low lung volumes,
lungs are clear. No pleural effusion or pneumothorax seen. Heart
size and mediastinal contours are stable. No acute or suspicious
osseous finding.
IMPRESSION: No active cardiopulmonary disease. No evidence of pneumonia or
pulmonary edema.

## 2020-05-08 ENCOUNTER — Emergency Department (HOSPITAL_BASED_OUTPATIENT_CLINIC_OR_DEPARTMENT_OTHER)
Admission: EM | Admit: 2020-05-08 | Discharge: 2020-05-09 | Disposition: A | Discharge status: Home / Self Care | Payer: Medicare Other | Attending: Emergency Medicine | Admitting: Emergency Medicine

## 2020-05-08 ENCOUNTER — Other Ambulatory Visit: Payer: Self-pay

## 2020-05-08 ENCOUNTER — Encounter (HOSPITAL_BASED_OUTPATIENT_CLINIC_OR_DEPARTMENT_OTHER): Payer: Self-pay | Admitting: Emergency Medicine

## 2020-05-08 DIAGNOSIS — Z7982 Long term (current) use of aspirin: Secondary | ICD-10-CM | POA: Diagnosis not present

## 2020-05-08 DIAGNOSIS — I10 Essential (primary) hypertension: Secondary | ICD-10-CM | POA: Insufficient documentation

## 2020-05-08 DIAGNOSIS — Z87891 Personal history of nicotine dependence: Secondary | ICD-10-CM | POA: Diagnosis not present

## 2020-05-08 DIAGNOSIS — A084 Viral intestinal infection, unspecified: Secondary | ICD-10-CM | POA: Insufficient documentation

## 2020-05-08 DIAGNOSIS — Z79899 Other long term (current) drug therapy: Secondary | ICD-10-CM | POA: Diagnosis not present

## 2020-05-08 DIAGNOSIS — R1013 Epigastric pain: Secondary | ICD-10-CM | POA: Diagnosis present

## 2020-05-08 DIAGNOSIS — K219 Gastro-esophageal reflux disease without esophagitis: Secondary | ICD-10-CM | POA: Diagnosis not present

## 2020-05-08 NOTE — ED Triage Notes (Addendum)
Pt c/o epigastric pain with nausea and diarrhea. Pt states blood in stool. Pt is on xerelto for PE.

## 2020-05-09 ENCOUNTER — Emergency Department (HOSPITAL_BASED_OUTPATIENT_CLINIC_OR_DEPARTMENT_OTHER): Payer: Medicare Other

## 2020-05-09 DIAGNOSIS — A084 Viral intestinal infection, unspecified: Secondary | ICD-10-CM | POA: Diagnosis not present

## 2020-05-09 LAB — COMPREHENSIVE METABOLIC PANEL
ALT: 27 U/L (ref 0–44)
AST: 21 U/L (ref 15–41)
Albumin: 4.2 g/dL (ref 3.5–5.0)
Alkaline Phosphatase: 91 U/L (ref 38–126)
Anion gap: 11 (ref 5–15)
BUN: 17 mg/dL (ref 8–23)
CO2: 26 mmol/L (ref 22–32)
Calcium: 9.4 mg/dL (ref 8.9–10.3)
Chloride: 96 mmol/L — ABNORMAL LOW (ref 98–111)
Creatinine, Ser: 0.92 mg/dL (ref 0.44–1.00)
GFR, Estimated: >60 mL/min (ref >60)
Glucose, Bld: 135 mg/dL — ABNORMAL HIGH (ref 70–99)
Potassium: 3.7 mmol/L (ref 3.5–5.1)
Sodium: 133 mmol/L — ABNORMAL LOW (ref 135–145)
Total Bilirubin: 0.8 mg/dL (ref 0.3–1.2)
Total Protein: 8.4 g/dL — ABNORMAL HIGH (ref 6.5–8.1)

## 2020-05-09 LAB — CBC WITH DIFFERENTIAL/PLATELET
Abs Immature Granulocytes: 0.06 10*3/uL (ref 0.00–0.07)
Basophils Absolute: 0.1 10*3/uL (ref 0.0–0.1)
Basophils Relative: 1 %
Eosinophils Absolute: 0 10*3/uL (ref 0.0–0.5)
Eosinophils Relative: 0 %
HCT: 43.1 % (ref 36.0–46.0)
Hemoglobin: 14.2 g/dL (ref 12.0–15.0)
Immature Granulocytes: 0 %
Lymphocytes Relative: 22 %
Lymphs Abs: 4 10*3/uL (ref 0.7–4.0)
MCH: 26.9 pg (ref 26.0–34.0)
MCHC: 32.9 g/dL (ref 30.0–36.0)
MCV: 81.6 fL (ref 80.0–100.0)
Monocytes Absolute: 1.9 10*3/uL — ABNORMAL HIGH (ref 0.1–1.0)
Monocytes Relative: 10 %
Neutro Abs: 12 10*3/uL — ABNORMAL HIGH (ref 1.7–7.7)
Neutrophils Relative %: 67 %
Platelets: 262 10*3/uL (ref 150–400)
RBC: 5.28 MIL/uL — ABNORMAL HIGH (ref 3.87–5.11)
RDW: 15.7 % — ABNORMAL HIGH (ref 11.5–15.5)
WBC: 18 10*3/uL — ABNORMAL HIGH (ref 4.0–10.5)
nRBC: 0 % (ref 0.0–0.2)

## 2020-05-09 LAB — LIPASE, BLOOD: Lipase: 38 U/L (ref 11–51)

## 2020-05-09 MED ORDER — PANTOPRAZOLE SODIUM 40 MG IV SOLR
40.0000 mg | Freq: Once | INTRAVENOUS | Status: AC
  Administered 2020-05-09: 40 mg via INTRAVENOUS
  Filled 2020-05-09: qty 40

## 2020-05-09 MED ORDER — SUCRALFATE 1 GM/10ML PO SUSP
1.0000 g | Freq: Once | ORAL | Status: AC
  Administered 2020-05-09: 1 g via ORAL
  Filled 2020-05-09: qty 10

## 2020-05-09 MED ORDER — ONDANSETRON 8 MG PO TBDP: 8.0000 mg | ORAL_TABLET | Freq: Three times a day (TID) | ORAL | 0 refills | Status: DC | PRN

## 2020-05-09 MED ORDER — SODIUM CHLORIDE 0.9 % IV BOLUS
500.0000 mL | Freq: Once | INTRAVENOUS | Status: AC
  Administered 2020-05-09: 500 mL via INTRAVENOUS

## 2020-05-09 MED ORDER — IOHEXOL 300 MG/ML  SOLN
100.0000 mL | Freq: Once | INTRAMUSCULAR | Status: AC | PRN
  Administered 2020-05-09: 100 mL via INTRAVENOUS

## 2020-05-09 MED ORDER — OMEPRAZOLE 20 MG PO CPDR: 20.0000 mg | DELAYED_RELEASE_CAPSULE | Freq: Every day | ORAL | 0 refills | Status: DC

## 2020-05-09 MED ORDER — FENTANYL CITRATE (PF) 100 MCG/2ML IJ SOLN
50.0000 ug | Freq: Once | INTRAMUSCULAR | Status: AC
Start: 2020-05-09 — End: 2020-05-09
  Administered 2020-05-09: 50 ug via INTRAVENOUS
  Filled 2020-05-09: qty 2

## 2020-05-09 MED ORDER — ONDANSETRON HCL 4 MG/2ML IJ SOLN
4.0000 mg | Freq: Once | INTRAMUSCULAR | Status: AC
  Administered 2020-05-09: 4 mg via INTRAVENOUS
  Filled 2020-05-09: qty 2

## 2020-05-09 NOTE — ED Provider Notes (Signed)
[facility] Provider Note: Pamela Dell, MD, FACEP  CSN: [id number] MRN: [id number] ARRIVAL: 05/08/20 at 2225 ROOM: MH09/MH09   CHIEF COMPLAINT  Abdominal Pain   HISTORY OF PRESENT ILLNESS  05/09/20 12:08 AM Pamela Olson is a 70 y.o. female who developed lightheadedness yesterday morning.  This was followed by epigastric pain which persists.  She thought the epigastric pain was indigestion and she took Prilosec without relief.  She now rates the pain as a 9 out of 10, worse with palpation or movement.  She has had a loss of appetite.  When she has tried to drink something it causes her to vomit.  She estimates she has vomited 4 times.  She has also had diarrhea.  The she describes the diarrhea as blood-streaked.  She is on Xarelto for thromboembolic disease.   Past Medical History:  Diagnosis Date  . GERD (gastroesophageal reflux disease)   . Hypertension   . Neuropathic pain of right foot   . Pulmonary emboli [facility])     Past Surgical History:  Procedure Laterality Date  . HERNIA REPAIR    . ROTATOR CUFF REPAIR Right 2013    No family history on file.  Social History   Tobacco Use  . Smoking status: Former Games developer  . Smokeless tobacco: Never Used  Substance Use Topics  . Alcohol use: No  . Drug use: No    Prior to Admission medications   Medication Sig Start Date End Date Taking? Authorizing Provider  omeprazole (PRILOSEC) 20 MG capsule Take 1 capsule (20 mg total) by mouth daily. 05/09/20  Yes Lucia Mccreadie, MD  ondansetron (ZOFRAN ODT) 8 MG disintegrating tablet Take 1 tablet (8 mg total) by mouth every 8 (eight) hours as needed. 05/09/20  Yes Jailyne Chieffo, MD  aspirin 81 MG chewable tablet Chew by mouth daily.    [provider]  gabapentin (NEURONTIN) 300 MG capsule Take 300 mg by mouth 3 (three) times daily.    [provider]  losartan (COZAAR) 100 MG tablet Take 100 mg by mouth daily.    [provider]  Rivaroxaban  (XARELTO) 15 MG TABS tablet Take 1 tablet (15 mg total) by mouth 2 (two) times daily with a meal. Note patient to get future prescriptions through PCP 03/07/16   Leroy Sea, MD  solifenacin (VESICARE) 10 MG tablet Take 10 mg by mouth daily as needed for bladder spasms. 09/27/15   [provider]  traZODone (DESYREL) 50 MG tablet Take 50 mg by mouth at bedtime as needed for sleep.  09/27/15   [provider]    Allergies Asa [aspirin]   REVIEW OF SYSTEMS  Negative except as noted here or in the History of Present Illness.   PHYSICAL EXAMINATION  Initial Vital Signs Blood pressure (!) 167/79, pulse 69, temperature 98 F (36.7 C), temperature source Oral, resp. rate 20, height 5\' 1"  (1.549 m), weight 69.9 kg, SpO2 98 %.  Examination General: Well-developed, well-nourished female in no acute distress; appearance consistent with age of record HENT: normocephalic; atraumatic Eyes: pupils equal, round and reactive to light; extraocular muscles intact; arcus senilis bilaterally Neck: supple Heart: regular rate and rhythm Lungs: clear to auscultation bilaterally Abdomen: soft; nondistended; epigastric tenderness; no masses or hepatosplenomegaly; bowel sounds present Extremities: No deformity; full range of motion; pulses normal Neurologic: Awake, alert and oriented; motor function intact in all extremities and symmetric; no facial droop Skin: Warm and dry Psychiatric: Normal mood and affect   RESULTS  Summary of this visit's results, reviewed and interpreted by myself:   EKG Interpretation  Date/Time:    Ventricular Rate:    PR Interval:    QRS Duration:   QT Interval:    QTC Calculation:   R Axis:     Text Interpretation:        Laboratory Studies: Results for orders placed or performed during the hospital encounter of 05/08/20 (from the past 24 hour(s))  Lipase, blood     Status: None   Collection Time: 05/08/20 11:55 PM  Result Value Ref Range    Lipase 38 11 - 51 U/L  Comprehensive metabolic panel     Status: Abnormal   Collection Time: 05/08/20 11:55 PM  Result Value Ref Range   Sodium 133 (L) 135 - 145 mmol/L   Potassium 3.7 3.5 - 5.1 mmol/L   Chloride 96 (L) 98 - 111 mmol/L   CO2 26 22 - 32 mmol/L   Glucose, Bld 135 (H) 70 - 99 mg/dL   BUN 17 8 - 23 mg/dL   Creatinine, Ser 9.38 0.44 - 1.00 mg/dL   Calcium 9.4 8.9 - 18.2 mg/dL   Total Protein 8.4 (H) 6.5 - 8.1 g/dL   Albumin 4.2 3.5 - 5.0 g/dL   AST 21 15 - 41 U/L   ALT 27 0 - 44 U/L   Alkaline Phosphatase 91 38 - 126 U/L   Total Bilirubin 0.8 0.3 - 1.2 mg/dL   GFR, Estimated >99 >37 mL/min   Anion gap 11 5 - 15  CBC with Differential     Status: Abnormal   Collection Time: 05/08/20 11:55 PM  Result Value Ref Range   WBC 18.0 (H) 4.0 - 10.5 K/uL   RBC 5.28 (H) 3.87 - 5.11 MIL/uL   Hemoglobin 14.2 12.0 - 15.0 g/dL   HCT 16.9 67.8 - 93.8 %   MCV 81.6 80.0 - 100.0 fL   MCH 26.9 26.0 - 34.0 pg   MCHC 32.9 30.0 - 36.0 g/dL   RDW 10.1 (H) 75.1 - 02.5 %   Platelets 262 150 - 400 K/uL   nRBC 0.0 0.0 - 0.2 %   Neutrophils Relative % 67 %   Neutro Abs 12.0 (H) 1.7 - 7.7 K/uL   Lymphocytes Relative 22 %   Lymphs Abs 4.0 0.7 - 4.0 K/uL   Monocytes Relative 10 %   Monocytes Absolute 1.9 (H) 0.1 - 1.0 K/uL   Eosinophils Relative 0 %   Eosinophils Absolute 0.0 0.0 - 0.5 K/uL   Basophils Relative 1 %   Basophils Absolute 0.1 0.0 - 0.1 K/uL   Immature Granulocytes 0 %   Abs Immature Granulocytes 0.06 0.00 - 0.07 K/uL   Imaging Studies: CT ABDOMEN PELVIS W CONTRAST  Result Date: 05/09/2020 CLINICAL DATA:  Epigastric pain EXAM: CT ABDOMEN AND PELVIS WITH CONTRAST TECHNIQUE: Multidetector CT imaging of the abdomen and pelvis was performed using the standard protocol following bolus administration of intravenous contrast. CONTRAST:  OMNIPAQUE IOHEXOL 300 MG/ML  SOLN COMPARISON:  02/16/2018 FINDINGS: LOWER CHEST: Normal. HEPATOBILIARY: Normal hepatic contours. No intra- or  extrahepatic biliary dilatation. Status post cholecystectomy. PANCREAS: Normal pancreas. No ductal dilatation or peripancreatic fluid collection. SPLEEN: Normal. ADRENALS/URINARY TRACT: The adrenal glands are normal. No hydronephrosis, nephroureterolithiasis or solid renal mass. The urinary bladder is normal for degree of distention STOMACH/BOWEL: There is no hiatal hernia. Normal duodenal course and caliber. No small bowel dilatation or inflammation. No focal colonic abnormality. Normal appendix. VASCULAR/LYMPHATIC: Normal course  and caliber of the major abdominal vessels. No abdominal or pelvic lymphadenopathy. REPRODUCTIVE: Normal uterus. No adnexal mass. MUSCULOSKELETAL. No bony spinal canal stenosis or focal osseous abnormality. OTHER: None. IMPRESSION: No acute abnormality of the abdomen or pelvis. Electronically Signed   By: Deatra Robinson M.D.   On: 05/09/2020 01:49    ED COURSE and MDM  Nursing notes, initial and subsequent vitals signs, including pulse oximetry, reviewed and interpreted by myself.  Vitals:   05/08/20 2242 05/08/20 2245 05/08/20 2352 05/09/20 0000  BP:  (!) 178/70 (!) 167/79 (!) 150/72  Pulse:  63 69 62  Resp:  18 20 18   Temp:  98 F (36.7 C)    TempSrc:  Oral    SpO2:  98% 98% 98%  Weight: 69.9 kg     Height: 5\' 1"  (1.549 m)      Medications  sucralfate (CARAFATE) 1 GM/10ML suspension 1 g (has no administration in time range)  pantoprazole (PROTONIX) injection 40 mg (40 mg Intravenous Given 05/09/20 0022)  ondansetron (ZOFRAN) injection 4 mg (4 mg Intravenous Given 05/09/20 0023)  fentaNYL (SUBLIMAZE) injection 50 mcg (50 mcg Intravenous Given 05/09/20 0023)  sodium chloride 0.9 % bolus 500 mL ( Intravenous Stopped 05/09/20 0141)  iohexol (OMNIPAQUE) 300 MG/ML solution 100 mL (100 mLs Intravenous Contrast Given 05/09/20 0053)   2:15 AM Patient advised of reassuring CT scan.  I suspect her symptoms are due to an acute viral infection.  This would be consistent with her  leukocytosis.  She is not currently taking omeprazole on a regular basis and she was advised to take it daily for at least the next 2 weeks as she may have an element of gastritis persisting.  We will provide a prescription for Zofran.  She was advised she may use over-the-counter antidiarrheals as needed.   PROCEDURES  Procedures   ED DIAGNOSES     ICD-10-CM   1. Viral gastroenteritis  A08.4        Peaches Vanoverbeke, MD 05/09/20 [phone]

## 2021-08-10 IMAGING — CT CT ABD-PELV W/ CM
2 of 5 series · 17 of 46 positions shown, 19 images · IV contrast (Omnipaque)
Comparison: 02/16/2018

CLINICAL DATA: Epigastric pain

EXAM:
CT ABDOMEN AND PELVIS WITH CONTRAST
TECHNIQUE: Multidetector CT imaging of the abdomen and pelvis was performed
using the standard protocol following bolus administration of
intravenous contrast.
CONTRAST:  100mL OMNIPAQUE IOHEXOL 300 MG/ML  SOLN

[Series 2: axial st · axial · 0.88mm/px · z∈[+919,+1279]mm · 14 of 82 slices shown, 16 images]
[im 5/82  soft-tissue]
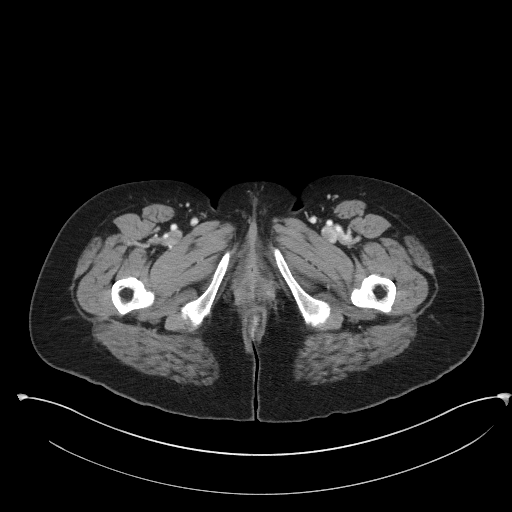
[im 5/82  bone]
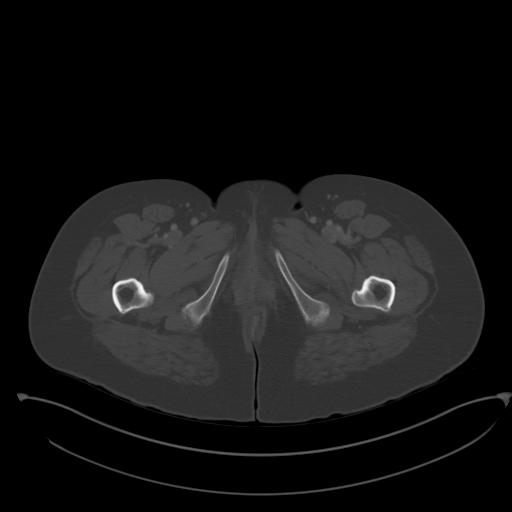
[im 9/82  soft-tissue]
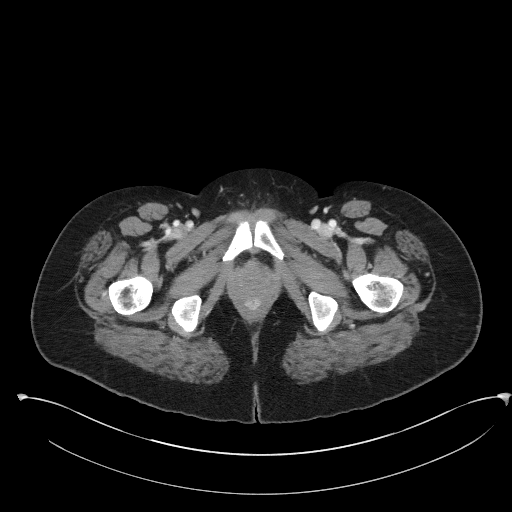
[im 18/82  soft-tissue]
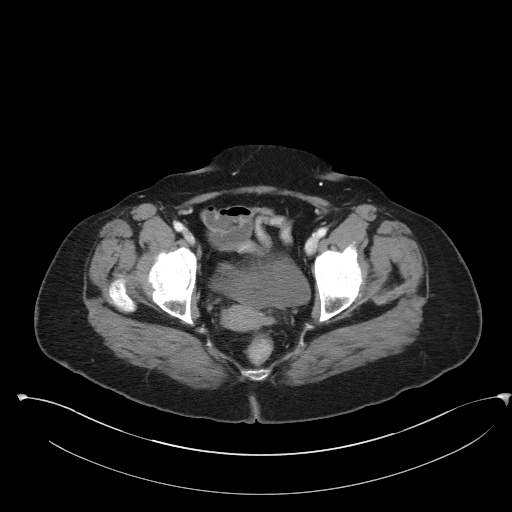
[im 22/82  soft-tissue]
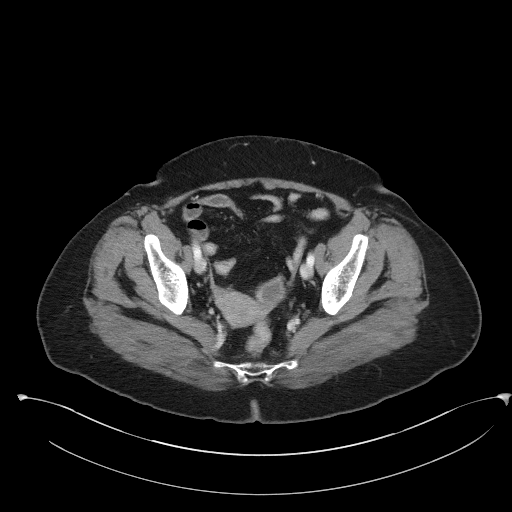
[im 26/82  soft-tissue]
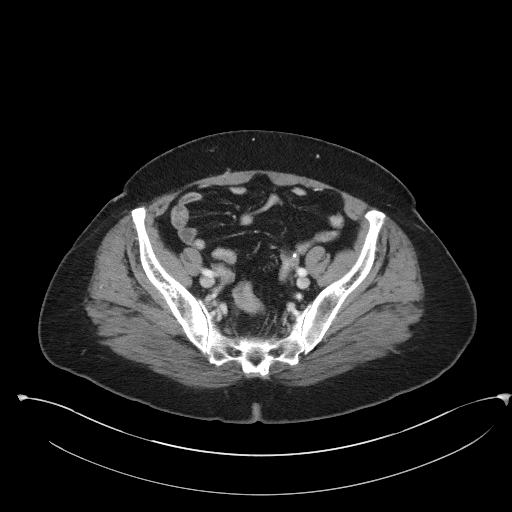
[im 35/82  soft-tissue]
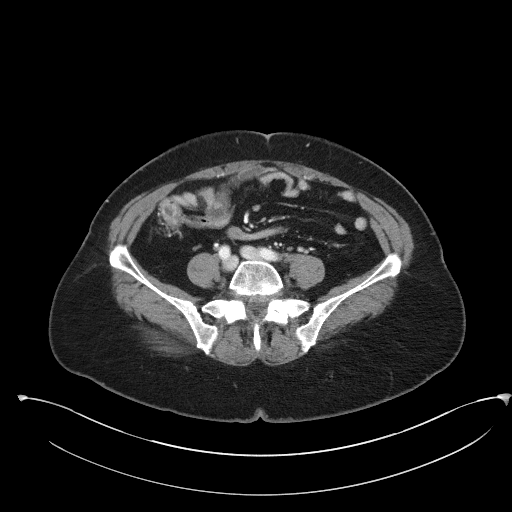
[im 39/82  soft-tissue]
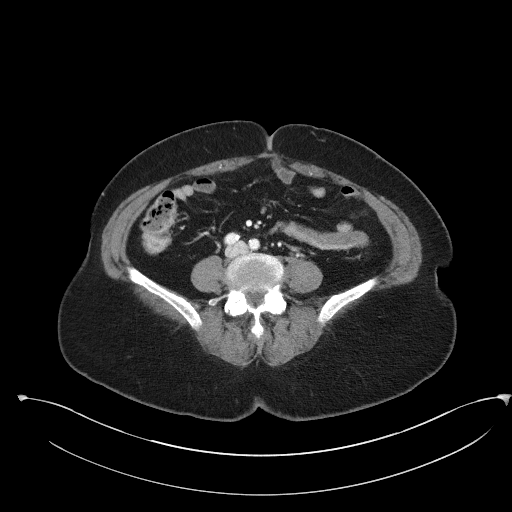
[im 43/82  soft-tissue]
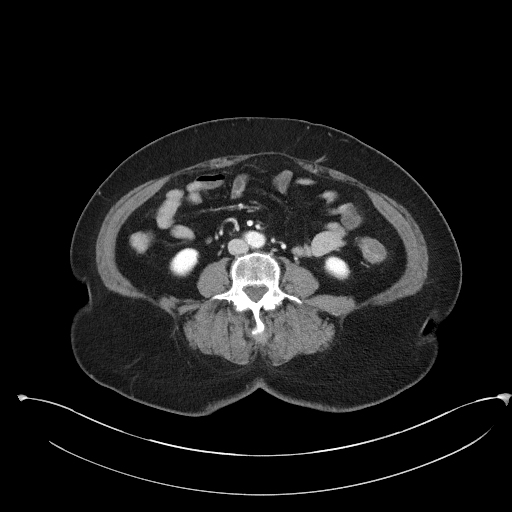
[im 47/82  soft-tissue]
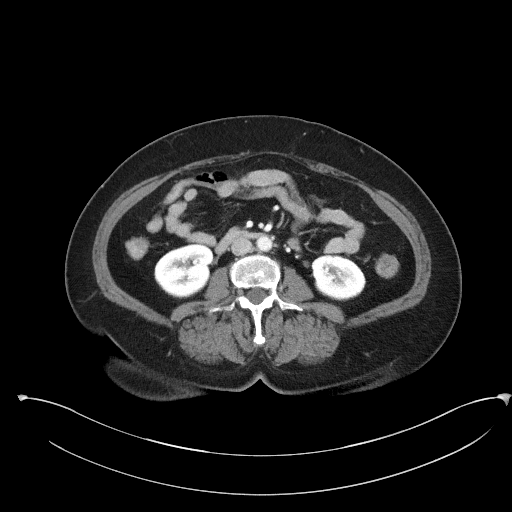
[im 47/82  bone]
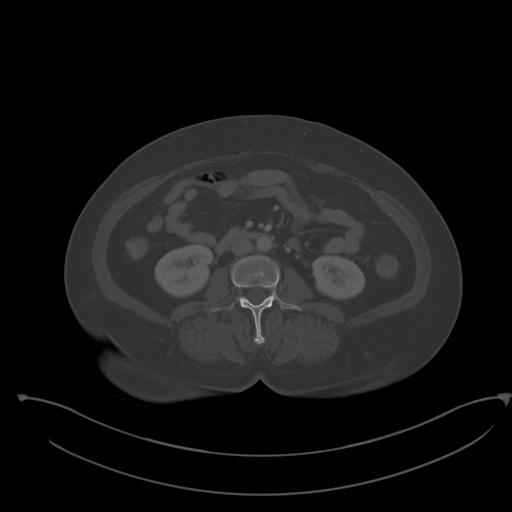
[im 56/82  soft-tissue]
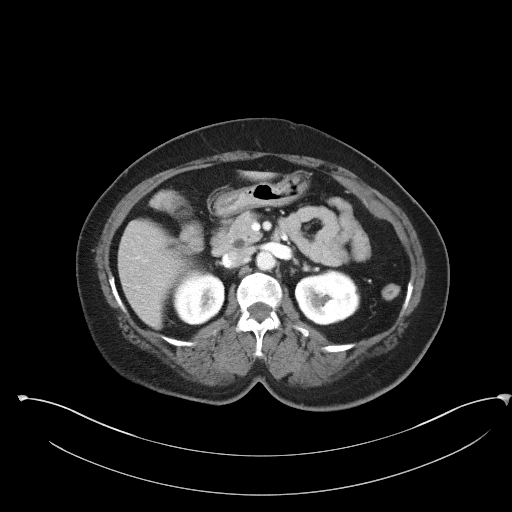
[im 60/82  soft-tissue]
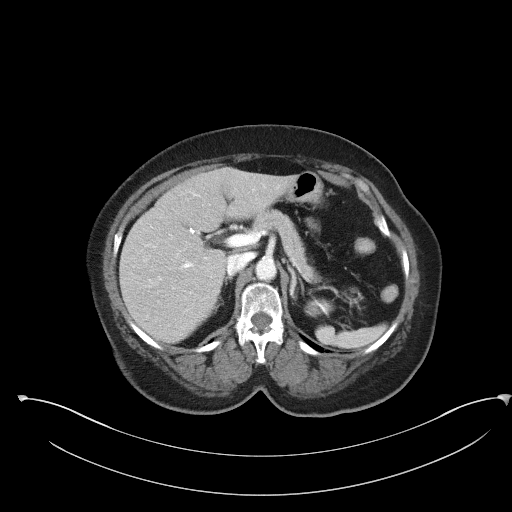
[im 64/82  soft-tissue]
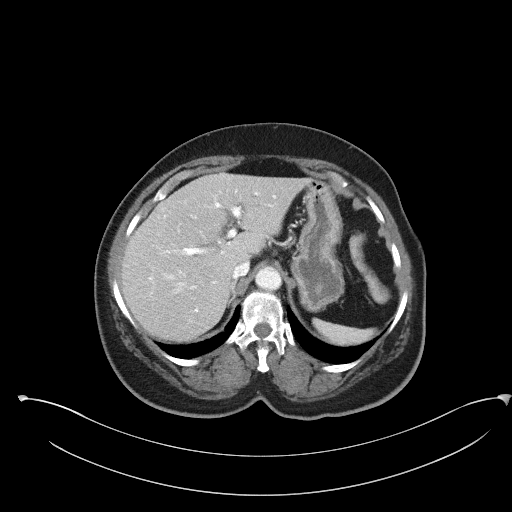
[im 73/82  soft-tissue]
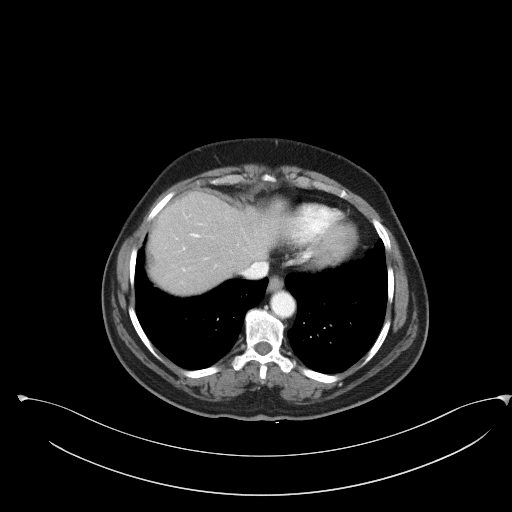
[im 77/82  soft-tissue]
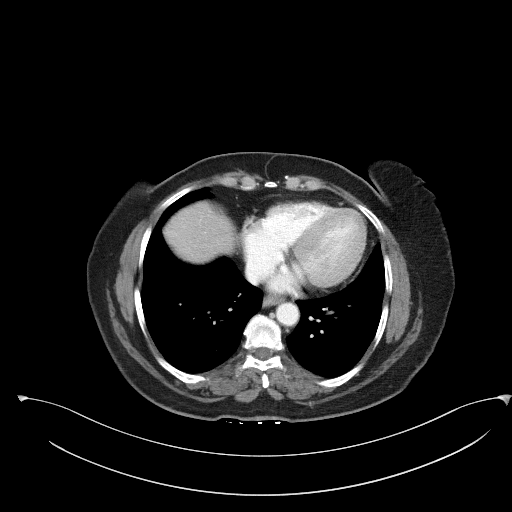

[Series 5: coronal st · coronal · 0.75mm/px · 3 of 80 slices shown]
[im 27/80  soft-tissue]
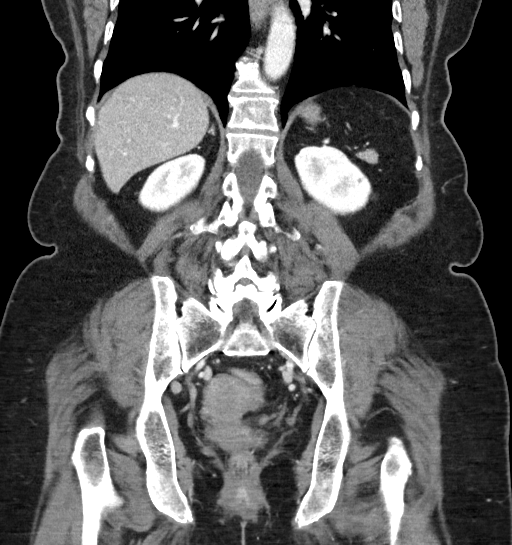
[im 36/80  soft-tissue]
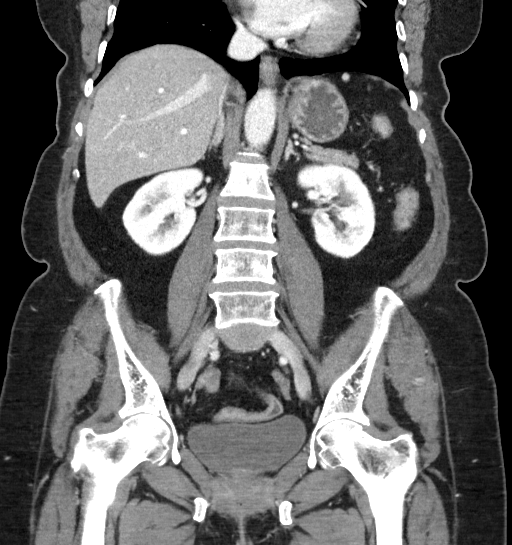
[im 44/80  soft-tissue]
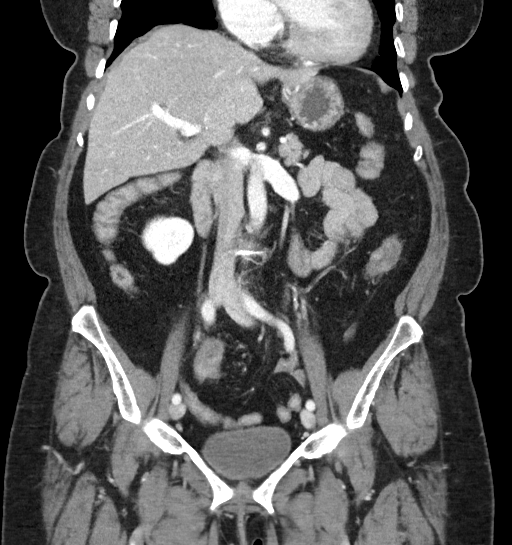

[17 of 46 positions shown; findings below may reference images not displayed]

FINDINGS: LOWER CHEST: Normal.

HEPATOBILIARY: Normal hepatic contours. No intra- or extrahepatic
biliary dilatation. Status post cholecystectomy.

PANCREAS: Normal pancreas. No ductal dilatation or peripancreatic
fluid collection.

SPLEEN: Normal.

ADRENALS/URINARY TRACT: The adrenal glands are normal. No
hydronephrosis, nephroureterolithiasis or solid renal mass. The
urinary bladder is normal for degree of distention

STOMACH/BOWEL: There is no hiatal hernia. Normal duodenal course and
caliber. No small bowel dilatation or inflammation. No focal colonic
abnormality. Normal appendix.

VASCULAR/LYMPHATIC: Normal course and caliber of the major abdominal
vessels. No abdominal or pelvic lymphadenopathy.

REPRODUCTIVE: Normal uterus. No adnexal mass.

MUSCULOSKELETAL. No bony spinal canal stenosis or focal osseous
abnormality.

OTHER: None.
IMPRESSION: No acute abnormality of the abdomen or pelvis.

## 2022-03-30 ENCOUNTER — Ambulatory Visit: Payer: Medicare Other | Admitting: Urology

## 2022-03-30 ENCOUNTER — Encounter: Payer: Self-pay | Admitting: Urology

## 2022-03-30 VITALS — BP 155/77 | HR 78 | Ht 61.0 in | Wt 160.0 lb

## 2022-03-30 DIAGNOSIS — N3941 Urge incontinence: Secondary | ICD-10-CM

## 2022-03-30 MED ORDER — SOLIFENACIN SUCCINATE 10 MG PO TABS: 10.0000 mg | ORAL_TABLET | Freq: Every day | ORAL | 3 refills | Status: DC

## 2022-03-30 NOTE — Progress Notes (Signed)
Assessment: 1. Urge incontinence     Plan: I personally reviewed the patient's chart including provider notes, and lab results. I reviewed records from [facility]. Continue Solifenacin 10 mg daily.  Refill sent. Return to office in 1 year.  Chief Complaint:  Chief Complaint  Patient presents with   Urinary Incontinence    History of Present Illness:  Pamela Olson is a 72 y.o. female who is seen for continued evaluation of urge incontinence, nocturia, and frequency.  She was previously followed at [facility] in [facility].  Her last visit with me was in 6/22.  She was initially seen in early 2015 and was given a trial of Toviaz without benefit.  She was given samples of Vesicare 5 mg and reported improvement in her symptoms with the medication.  She noted better results with 10 mg dose.  She continued on Vesicare 10 mg daily which has worked well for her symptoms.  No side effects from the medication.  Her U/A from 7/19 showed 3-8 RBCs. At her visit in 6/22, she continued on Vesicare 10 mg daily. Her symptoms were well controlled with the medication. She occasionally forgets to take the medication and has an increase in symptoms. No side effects from the medication. No dysuria or gross hematuria.   She presents today for follow-up.  She continues on Solifenacin 10 mg daily.  She reports good control of her urinary symptoms when she takes the medication.  No side effects.  No dysuria or gross hematuria.   Past Medical History:  Past Medical History:  Diagnosis Date   GERD (gastroesophageal reflux disease)    Hypertension    Neuropathic pain of right foot    Pulmonary emboli [facility])     Past Surgical History:  Past Surgical History:  Procedure Laterality Date   HERNIA REPAIR     ROTATOR CUFF REPAIR Right 2013    Allergies:  Allergies  Allergen Reactions   Asa [Aspirin] Other (See Comments)    GI sx's States "the Dr told me not to take it  because of my hernia" and acid reflux   Codeine Other (See Comments)    Other Reaction(s): Dizziness   Tramadol Itching    Family History:  No family history on file.  Social History:  Social History   Tobacco Use   Smoking status: Former   Smokeless tobacco: Never  Substance Use Topics   Alcohol use: No   Drug use: No    Review of symptoms:  Constitutional:  Negative for unexplained weight loss, night sweats, fever, chills ENT:  Negative for nose bleeds, sinus pain, painful swallowing CV:  Negative for chest pain, shortness of breath, exercise intolerance, palpitations, loss of consciousness Resp:  Negative for cough, wheezing, shortness of breath GI:  Negative for nausea, vomiting, diarrhea, bloody stools GU:  Positives noted in HPI; otherwise negative for gross hematuria, dysuria Neuro:  Negative for seizures, poor balance, limb weakness, slurred speech Psych:  Negative for lack of energy, depression, anxiety Endocrine:  Negative for polydipsia, polyuria, symptoms of hypoglycemia (dizziness, hunger, sweating) Hematologic:  Negative for anemia, purpura, petechia, prolonged or excessive bleeding, use of anticoagulants  Allergic:  Negative for difficulty breathing or choking as a result of exposure to anything; no shellfish allergy; no allergic response (rash/itch) to materials, foods  Physical exam: BP (!) 155/77   Pulse 78   Ht 5\' 1"  (1.549 m)   Wt 160 lb (72.6 kg)   BMI 30.23 kg/m  GENERAL APPEARANCE:  Well appearing, well developed, well nourished, NAD HEENT:  Atraumatic, normocephalic, oropharynx clear NECK:  Supple without lymphadenopathy or thyromegaly ABDOMEN:  Soft, non-tender, no masses EXTREMITIES:  Moves all extremities well, without clubbing, cyanosis, or edema NEUROLOGIC:  Alert and oriented x 3, normal gait, CN II-XII grossly intact MENTAL STATUS:  appropriate BACK:  Non-tender to palpation, No CVAT SKIN:  Warm, dry, and intact  Results: No specimen  provided

## 2022-03-30 NOTE — Addendum Note (Signed)
Addended by: Evelina Bucy on: 03/30/2022 01:36 PM   Modules accepted: Orders

## 2022-06-24 ENCOUNTER — Encounter (HOSPITAL_BASED_OUTPATIENT_CLINIC_OR_DEPARTMENT_OTHER): Payer: Self-pay | Admitting: Radiology

## 2022-06-24 ENCOUNTER — Other Ambulatory Visit: Payer: Self-pay

## 2022-06-24 ENCOUNTER — Emergency Department (HOSPITAL_BASED_OUTPATIENT_CLINIC_OR_DEPARTMENT_OTHER)
Admission: EM | Admit: 2022-06-24 | Discharge: 2022-06-24 | Disposition: A | Discharge status: Home / Self Care | Payer: Medicare Other | Attending: Emergency Medicine | Admitting: Emergency Medicine

## 2022-06-24 ENCOUNTER — Other Ambulatory Visit (HOSPITAL_BASED_OUTPATIENT_CLINIC_OR_DEPARTMENT_OTHER): Payer: Self-pay

## 2022-06-24 ENCOUNTER — Emergency Department (HOSPITAL_BASED_OUTPATIENT_CLINIC_OR_DEPARTMENT_OTHER): Payer: Medicare Other

## 2022-06-24 DIAGNOSIS — R42 Dizziness and giddiness: Secondary | ICD-10-CM | POA: Diagnosis present

## 2022-06-24 DIAGNOSIS — Z7901 Long term (current) use of anticoagulants: Secondary | ICD-10-CM | POA: Insufficient documentation

## 2022-06-24 DIAGNOSIS — I10 Essential (primary) hypertension: Secondary | ICD-10-CM | POA: Diagnosis not present

## 2022-06-24 DIAGNOSIS — I6522 Occlusion and stenosis of left carotid artery: Secondary | ICD-10-CM

## 2022-06-24 DIAGNOSIS — Z86711 Personal history of pulmonary embolism: Secondary | ICD-10-CM | POA: Diagnosis not present

## 2022-06-24 DIAGNOSIS — R0609 Other forms of dyspnea: Secondary | ICD-10-CM | POA: Diagnosis not present

## 2022-06-24 DIAGNOSIS — Z79899 Other long term (current) drug therapy: Secondary | ICD-10-CM | POA: Insufficient documentation

## 2022-06-24 DIAGNOSIS — Z87891 Personal history of nicotine dependence: Secondary | ICD-10-CM | POA: Insufficient documentation

## 2022-06-24 LAB — URINALYSIS, ROUTINE W REFLEX MICROSCOPIC
Bilirubin Urine: NEGATIVE
Glucose, UA: NEGATIVE mg/dL
Hgb urine dipstick: NEGATIVE
Ketones, ur: NEGATIVE mg/dL
Leukocytes,Ua: NEGATIVE
Nitrite: NEGATIVE
Protein, ur: NEGATIVE mg/dL
Specific Gravity, Urine: 1.015 (ref 1.005–1.030)
pH: 7.5 (ref 5.0–8.0)

## 2022-06-24 LAB — COMPREHENSIVE METABOLIC PANEL
ALT: 15 U/L (ref 0–44)
AST: 23 U/L (ref 15–41)
Albumin: 3.9 g/dL (ref 3.5–5.0)
Alkaline Phosphatase: 100 U/L (ref 38–126)
Anion gap: 10 (ref 5–15)
BUN: 16 mg/dL (ref 8–23)
CO2: 26 mmol/L (ref 22–32)
Calcium: 9.1 mg/dL (ref 8.9–10.3)
Chloride: 100 mmol/L (ref 98–111)
Creatinine, Ser: 0.89 mg/dL (ref 0.44–1.00)
GFR, Estimated: >60 mL/min (ref >60)
Glucose, Bld: 118 mg/dL — ABNORMAL HIGH (ref 70–99)
Potassium: 3.4 mmol/L — ABNORMAL LOW (ref 3.5–5.1)
Sodium: 136 mmol/L (ref 135–145)
Total Bilirubin: 0.4 mg/dL (ref 0.3–1.2)
Total Protein: 8 g/dL (ref 6.5–8.1)

## 2022-06-24 LAB — CBC WITH DIFFERENTIAL/PLATELET
Abs Immature Granulocytes: 0.01 10*3/uL (ref 0.00–0.07)
Basophils Absolute: 0.1 10*3/uL (ref 0.0–0.1)
Basophils Relative: 2 %
Eosinophils Absolute: 0.2 10*3/uL (ref 0.0–0.5)
Eosinophils Relative: 3 %
HCT: 38.6 % (ref 36.0–46.0)
Hemoglobin: 12.7 g/dL (ref 12.0–15.0)
Immature Granulocytes: 0 %
Lymphocytes Relative: 62 %
Lymphs Abs: 4.3 10*3/uL — ABNORMAL HIGH (ref 0.7–4.0)
MCH: 27.1 pg (ref 26.0–34.0)
MCHC: 32.9 g/dL (ref 30.0–36.0)
MCV: 82.3 fL (ref 80.0–100.0)
Monocytes Absolute: 0.9 10*3/uL (ref 0.1–1.0)
Monocytes Relative: 12 %
Neutro Abs: 1.5 10*3/uL — ABNORMAL LOW (ref 1.7–7.7)
Neutrophils Relative %: 21 %
Platelets: 205 10*3/uL (ref 150–400)
RBC: 4.69 MIL/uL (ref 3.87–5.11)
RDW: 15.3 % (ref 11.5–15.5)
WBC: 7 10*3/uL (ref 4.0–10.5)
nRBC: 0 % (ref 0.0–0.2)

## 2022-06-24 LAB — TROPONIN I (HIGH SENSITIVITY): Troponin I (High Sensitivity): 3 ng/L (ref <18)

## 2022-06-24 LAB — BRAIN NATRIURETIC PEPTIDE: B Natriuretic Peptide: 15.3 pg/mL (ref 0.0–100.0)

## 2022-06-24 LAB — MAGNESIUM: Magnesium: 1.9 mg/dL (ref 1.7–2.4)

## 2022-06-24 MED ORDER — MECLIZINE HCL 25 MG PO TABS
12.5000 mg | ORAL_TABLET | Freq: Once | ORAL | Status: DC
  Filled 2022-06-24: qty 1

## 2022-06-24 MED ORDER — MECLIZINE HCL 25 MG PO TABS
25.0000 mg | ORAL_TABLET | Freq: Once | ORAL | Status: AC
  Administered 2022-06-24: 25 mg via ORAL

## 2022-06-24 MED ORDER — MECLIZINE HCL 25 MG PO TABS
25.0000 mg | ORAL_TABLET | Freq: Two times a day (BID) | ORAL | 0 refills | Status: AC | PRN
  Filled 2022-06-24: qty 15, 8d supply, fill #0

## 2022-06-24 MED ORDER — IOHEXOL 350 MG/ML SOLN
100.0000 mL | Freq: Once | INTRAVENOUS | Status: AC | PRN
  Administered 2022-06-24: 100 mL via INTRAVENOUS

## 2022-06-24 MED ORDER — LOSARTAN POTASSIUM 25 MG PO TABS
100.0000 mg | ORAL_TABLET | Freq: Once | ORAL | Status: DC
  Filled 2022-06-24: qty 4

## 2022-06-24 NOTE — Discharge Instructions (Addendum)
Please take your blood pressure medications as prescribed.  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  Please return to the emergency department for any worsening or worrisome symptoms.

## 2022-06-24 NOTE — ED Provider Notes (Signed)
[place] Provider Note  CSN: [id number] Arrival date & time: 06/24/22 1028  Chief Complaint(s) Dizziness  HPI Pamela Olson is a 72 y.o. female with past medical history as below, significant for GERD, hypertension, PE on xarelto who presents to the ED with complaint of dizziness, room spinning sensation, intermittent exertional dyspnea.  Patient reports she stopped taking her blood pressure medication around a month ago because "I felt fine."  Around 3 weeks ago she began having dizziness, room spinning sensation, intermittent lightheadedness, exertional dyspnea.  No fevers or chills, no numbness or tingling to extremities that seems new.  No falls or head injuries.  She had brief episode of chest pain last night which was sharp, stabbing, midsternal, burning sensation that resolved after drinking a glass of water.  Chest pain not provoked by exertion.  No chest pain today.  She feels at times when she walks she does feel short of breath over the past month.  No fevers or chills, no recent travel or sick contacts.   Dizziness, room spinning sensation worsened when she walks or turns her head.  Denies having the symptoms in the past. denies vision changes   Past Medical History Past Medical History:  Diagnosis Date   GERD (gastroesophageal reflux disease)    Hypertension    Neuropathic pain of right foot    Pulmonary emboli [facility])    Patient Active Problem List   Diagnosis Date Noted   Left leg pain 03/06/2016   Chest pain 03/06/2016   Neuropathy 03/06/2016   Renal insufficiency 03/06/2016   GERD (gastroesophageal reflux disease) 03/06/2016   Pulmonary embolus (HCC) 03/05/2016   Home Medication(s) Prior to Admission medications   Medication Sig Start Date End Date Taking? Authorizing Provider  atorvastatin (LIPITOR) 40 MG tablet Take 40 mg by mouth daily. 04/06/22  Yes [provider]  azaTHIOprine (IMURAN) 50 MG tablet Take  50 mg by mouth daily. 04/23/22  Yes [provider]  gabapentin (NEURONTIN) 300 MG capsule Take 300 mg by mouth 3 (three) times daily.   Yes [provider]  losartan (COZAAR) 100 MG tablet Take 100 mg by mouth daily.   Yes [provider]  meclizine (ANTIVERT) 25 MG tablet Take 1 tablet (25 mg total) by mouth 2 (two) times daily as needed for dizziness. 06/24/22  Yes Sloan Leiter, DO  omeprazole (PRILOSEC) 40 MG capsule Take 40 mg by mouth daily. 04/01/22  Yes [provider]  rivaroxaban (XARELTO) 20 MG TABS tablet Take 20 mg by mouth daily with supper.   Yes [provider]  solifenacin (VESICARE) 10 MG tablet Take 1 tablet (10 mg total) by mouth daily. 03/30/22  Yes Stoneking, Danford Bad., MD  traZODone (DESYREL) 50 MG tablet Take 50 mg by mouth at bedtime as needed for sleep.  09/27/15  Yes [provider]  Vitamin D, Ergocalciferol, (DRISDOL) 1.25 MG (50000 UNIT) CAPS capsule Take 50,000 Units by mouth once a week. Mondays 04/08/22  Yes [provider]  ondansetron (ZOFRAN ODT) 8 MG disintegrating tablet Take 1 tablet (8 mg total) by mouth every 8 (eight) hours as needed. Patient not taking: Reported on 06/24/2022 05/09/20   Molpus, Jonny Ruiz, MD  Rivaroxaban (XARELTO) 15 MG TABS tablet Take 1 tablet (15 mg total) by mouth 2 (two) times daily with a meal. Note patient to get future prescriptions through PCP Patient not taking: Reported on 06/24/2022 03/07/16   Leroy Sea, MD  Past Surgical History Past Surgical History:  Procedure Laterality Date   HERNIA REPAIR     ROTATOR CUFF REPAIR Right 2013   Family History No family history on file.  Social History Social History   Tobacco Use   Smoking status: Former   Smokeless tobacco: Never  Substance Use Topics   Alcohol use: No   Drug use: No    Allergies Asa [aspirin], Codeine, and Tramadol  Review of Systems Review of Systems  Constitutional:  Positive for fatigue. Negative for activity change and fever.  HENT:  Negative for facial swelling and trouble swallowing.   Eyes:  Negative for discharge and redness.  Respiratory:  Positive for shortness of breath. Negative for cough.   Cardiovascular:  Positive for chest pain. Negative for palpitations.  Gastrointestinal:  Negative for abdominal pain, nausea and vomiting.  Genitourinary:  Negative for dysuria and flank pain.  Musculoskeletal:  Negative for back pain and gait problem.  Skin:  Negative for pallor and rash.  Neurological:  Positive for light-headedness. Negative for syncope and headaches.    Physical Exam Vital Signs  I have reviewed the triage vital signs BP 136/61   Pulse (!) 55   Temp 98 F (36.7 C) (Oral)   Resp 20   SpO2 100%  Physical Exam Vitals and nursing note reviewed.  Constitutional:      General: She is not in acute distress.    Appearance: Normal appearance. She is not ill-appearing.  HENT:     Head: Normocephalic and atraumatic. No raccoon eyes, Battle's sign, right periorbital erythema or left periorbital erythema.     Jaw: There is normal jaw occlusion. No trismus.     Right Ear: External ear normal.     Left Ear: External ear normal.     Nose: Nose normal.     Mouth/Throat:     Mouth: Mucous membranes are moist.  Eyes:     General: No scleral icterus.       Right eye: No discharge.        Left eye: No discharge.     Extraocular Movements: Extraocular movements intact.     Pupils: Pupils are equal, round, and reactive to light.  Cardiovascular:     Rate and Rhythm: Normal rate and regular rhythm.     Pulses: Normal pulses.     Heart sounds: Normal heart sounds.  Pulmonary:     Effort: Pulmonary effort is normal. No tachypnea, accessory muscle usage or respiratory distress.     Breath sounds: Normal breath sounds.  Abdominal:      General: Abdomen is flat.     Palpations: Abdomen is soft.     Tenderness: There is no abdominal tenderness. There is no guarding or rebound.  Musculoskeletal:        General: Normal range of motion.     Cervical back: No rigidity. No pain with movement.     Right lower leg: No edema.     Left lower leg: No edema.  Skin:    General: Skin is warm and dry.     Capillary Refill: Capillary refill takes less than 2 seconds.  Neurological:     Mental Status: She is alert and oriented to person, place, and time.     GCS: GCS eye subscore is 4. GCS verbal subscore is 5. GCS motor subscore is 6.     Cranial Nerves: Cranial nerves 2-12 are intact. No dysarthria or facial asymmetry.     Sensory: Sensation is  intact.     Motor: Motor function is intact.     Coordination: Coordination is intact. Finger-Nose-Finger Test normal.     Gait: Gait is intact.     Comments: Strength 5/5 BLUE BLLE  Psychiatric:        Mood and Affect: Mood normal.        Behavior: Behavior normal.     ED Results and Treatments Labs (all labs ordered are listed, but only abnormal results are displayed) Labs Reviewed  CBC WITH DIFFERENTIAL/PLATELET - Abnormal; Notable for the following components:      Result Value   Neutro Abs 1.5 (*)    Lymphs Abs 4.3 (*)    All other components within normal limits  COMPREHENSIVE METABOLIC PANEL - Abnormal; Notable for the following components:   Potassium 3.4 (*)    Glucose, Bld 118 (*)    All other components within normal limits  BRAIN NATRIURETIC PEPTIDE  MAGNESIUM  URINALYSIS, ROUTINE W REFLEX MICROSCOPIC  TROPONIN I (HIGH SENSITIVITY)  TROPONIN I (HIGH SENSITIVITY)                                                                                                                          Radiology CT ANGIO HEAD NECK W WO CM  Result Date: 06/24/2022 CLINICAL DATA:  Syncope/presyncope, cerebrovascular cause suspected EXAM: CT ANGIOGRAPHY HEAD AND NECK WITH AND WITHOUT  CONTRAST TECHNIQUE: Multidetector CT imaging of the head and neck was performed using the standard protocol during bolus administration of intravenous contrast. Multiplanar CT image reconstructions and MIPs were obtained to evaluate the vascular anatomy. Carotid stenosis measurements (when applicable) are obtained utilizing NASCET criteria, using the distal internal carotid diameter as the denominator. RADIATION DOSE REDUCTION: This exam was performed according to the departmental dose-optimization program which includes automated exposure control, adjustment of the mA and/or kV according to patient size and/or use of iterative reconstruction technique. CONTRAST:  OMNIPAQUE IOHEXOL 350 MG/ML SOLN COMPARISON:  None Available. FINDINGS: CT HEAD FINDINGS Brain: No evidence of acute infarction, hemorrhage, hydrocephalus, extra-axial collection or mass lesion/mass effect. Vascular: No hyperdense vessel or unexpected calcification. Skull: Normal. Negative for fracture or focal lesion. Sinuses/Orbits: No middle ear or mastoid effusion. Paranasal sinuses are clear. Orbits are unremarkable. Other: None. Review of the MIP images confirms the above findings CTA NECK FINDINGS Aortic arch: Standard branching. Imaged portion shows no evidence of aneurysm or dissection. No significant stenosis of the major arch vessel origins. Right carotid system: No evidence of dissection, stenosis (50% or greater), or occlusion. Left carotid system: No evidence of dissection, stenosis (50% or greater), or occlusion. Vertebral arteries: Right dominant. The left vertebral artery is diffusely small in caliber. Skeleton: Negative. Other neck: Negative. Upper chest: Negative. Review of the MIP images confirms the above findings CTA HEAD FINDINGS Anterior circulation: Severe focal stenosis of the proximal supraclinoid left ICA (series 11, image 219). No proximal occlusion. No aneurysm. Posterior circulation: No significant stenosis, proximal  occlusion, aneurysm, or  vascular malformation. Venous sinuses: As permitted by contrast timing, patent. Anatomic variants: None Review of the MIP images confirms the above findings IMPRESSION: 1. No acute intracranial abnormality. 2. 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 3. No hemodynamically significant stenosis in the neck. Electronically Signed   By: Lorenza Cambridge M.D.   On: 06/24/2022 12:18   DG Chest Portable 1 View  Result Date: 06/24/2022 CLINICAL DATA:  Exertional dyspnea EXAM: PORTABLE CHEST 1 VIEW COMPARISON:  X-ray 07/25/2019 FINDINGS: No consolidation, pneumothorax or effusion. No edema. Normal cardiopericardial silhouette. Overlapping cardiac leads. IMPRESSION: No acute cardiopulmonary disease. Electronically Signed   By: Karen Kays M.D.   On: 06/24/2022 11:50    Pertinent labs & imaging results that were available during my care of the patient were reviewed by me and considered in my medical decision making (see MDM for details).  Medications Ordered in ED Medications  iohexol (OMNIPAQUE) 350 MG/ML injection 100 mL (100 mLs Intravenous Contrast Given 06/24/22 1137)  meclizine (ANTIVERT) tablet 25 mg (25 mg Oral Given 06/24/22 1316)                                                                                                                                     Procedures Procedures  (including critical care time)  Medical Decision Making / ED Course    Medical Decision Making:    Lakeeta Folks is a 72 y.o. female with past medical history as below, significant for GERD, hypertension, who presents to the ED with complaint of dizziness, room spinning sensation, intermittent exertional dyspnea.. The complaint involves an extensive differential diagnosis and also carries with it a high risk of complications and morbidity.  Serious etiology was considered. Ddx includes but is not limited to: In my evaluation of this patient's dyspnea my DDx includes, but is  not limited to, pneumonia, pulmonary embolism, pneumothorax, pulmonary edema, metabolic acidosis, asthma, COPD, cardiac cause, anemia, anxiety, cva, vascular abnormality, metabolic derangement etc.    Complete initial physical exam performed, notably the patient  was no acute distress, neuroexam is nonfocal.  She has no hypoxia, no tachycardia..    Reviewed and confirmed nursing documentation for past medical history, family history, social history.  Vital signs reviewed.    Clinical Course as of 06/24/22 1504  Wed Jun 24, 2022  1136 Well's score 1.5 LOW RISK [SG]  1452 Feeling better [SG]  1457 Left ICA stenosis noted on angio [SG]    Clinical Course User Index [SG] Sloan Leiter, DO   Patient with symptoms approximately 3 weeks in duration.  Began approximately 1 week after stopping her blood pressure medication.  She has no numbness, tingling, no focal neurodeficits on exam.  No head injuries or headache.  No hypoxia or tachycardia or tachypnea.  She is compliant with her blood thinner.  PE less likely in setting of compliance with NOAC and stable vital signs.  Lab  reviewed, stable.  EKG without arrhythmia or ischemia.  Troponin negative x 1.  No chest pain or dyspnea.   CTA with left ICA stenosis.  Her symptoms have resolved following Antivert.  She is requesting discharge.  She is ambulatory with mild balance issues.  Room spinning sensation has subsided. Neuro exam is non -focal, gait steady, strength 5/5 BLUE BLLE Recommend she follow-up with vascular surgery regards to ICA stenosis. Recommend she take her antihypertensive as prescribed Follow-up with PCP and vascular surgery.  The patient improved significantly and was discharged in stable condition. Detailed discussions were had with the patient regarding current findings, and need for close f/u with PCP or on call doctor. The patient has been instructed to return immediately if the symptoms worsen in any way for re-evaluation.  Patient verbalized understanding and is in agreement with current care plan. All questions answered prior to discharge.           Additional history obtained: -Additional history obtained from family -External records from outside source obtained and reviewed including: Chart review including previous notes, labs, imaging, consultation notes including primary care recommendation, labs and imaging medications   Lab Tests: -I ordered, reviewed, and interpreted labs.   The pertinent results include:   Labs Reviewed  CBC WITH DIFFERENTIAL/PLATELET - Abnormal; Notable for the following components:      Result Value   Neutro Abs 1.5 (*)    Lymphs Abs 4.3 (*)    All other components within normal limits  COMPREHENSIVE METABOLIC PANEL - Abnormal; Notable for the following components:   Potassium 3.4 (*)    Glucose, Bld 118 (*)    All other components within normal limits  BRAIN NATRIURETIC PEPTIDE  MAGNESIUM  URINALYSIS, ROUTINE W REFLEX MICROSCOPIC  TROPONIN I (HIGH SENSITIVITY)  TROPONIN I (HIGH SENSITIVITY)    Notable for labs stable  EKG   EKG Interpretation  Date/Time:  Wednesday June 24 2022 10:43:21 EDT Ventricular Rate:  67 PR Interval:  162 QRS Duration: 92 QT Interval:  394 QTC Calculation: 416 R Axis:   -10 Text Interpretation: Sinus rhythm Left ventricular hypertrophy similar to prior no stemi Confirmed by Tanda Rockers ([id number]) on 06/24/2022 10:44:45 AM         Imaging Studies ordered: I ordered imaging studies including CTA CXR I independently visualized the following imaging with scope of interpretation limited to determining acute life threatening conditions related to emergency care; findings noted above, significant for ica stenosis  I independently visualized and interpreted imaging. I agree with the radiologist interpretation   Medicines ordered and prescription drug management: Meds ordered this encounter  Medications   iohexol (OMNIPAQUE) 350  MG/ML injection 100 mL   DISCONTD: losartan (COZAAR) tablet 100 mg   DISCONTD: meclizine (ANTIVERT) tablet 12.5 mg   meclizine (ANTIVERT) tablet 25 mg   meclizine (ANTIVERT) 25 MG tablet    Sig: Take 1 tablet (25 mg total) by mouth 2 (two) times daily as needed for dizziness.    Dispense:  15 tablet    Refill:  0    -I have reviewed the patients home medicines and have made adjustments as needed   Consultations Obtained: na   Cardiac Monitoring: The patient was maintained on a cardiac monitor.  I personally viewed and interpreted the cardiac monitored which showed an underlying rhythm of: NSR Continues present symmetry 99-100% on room air  Social Determinants of Health:  Diagnosis or treatment significantly limited by social determinants of health: former smoker   Reevaluation:  After the interventions noted above, I reevaluated the patient and found that they have resolved  Co morbidities that complicate the patient evaluation  Past Medical History:  Diagnosis Date   GERD (gastroesophageal reflux disease)    Hypertension    Neuropathic pain of right foot    Pulmonary emboli (HCC)       Dispostion: Disposition decision including need for hospitalization was considered, and patient discharged from emergency department.    Final Clinical Impression(s) / ED Diagnoses Final diagnoses:  Vertigo  Internal carotid artery stenosis, left     This chart was dictated using voice recognition software.  Despite best efforts to proofread,  errors can occur which can change the documentation meaning.    Tanda Rockers A, DO 06/24/22 1504

## 2022-06-24 NOTE — ED Triage Notes (Signed)
Pt states for the past two weeks she has been having dizziness and headache on and off. Unsteady balance when she walks. Pt walked back to treatment room no issues. Pt states she does get sob when she walks fast.

## 2022-11-26 ENCOUNTER — Other Ambulatory Visit: Payer: Self-pay | Admitting: Urology

## 2022-11-26 DIAGNOSIS — N3941 Urge incontinence: Secondary | ICD-10-CM

## 2023-03-05 ENCOUNTER — Telehealth: Payer: Self-pay | Admitting: Urology

## 2023-03-05 ENCOUNTER — Other Ambulatory Visit: Payer: Self-pay | Admitting: Urology

## 2023-03-05 DIAGNOSIS — N3941 Urge incontinence: Secondary | ICD-10-CM

## 2023-03-05 MED ORDER — SOLIFENACIN SUCCINATE 10 MG PO TABS: 10.0000 mg | ORAL_TABLET | Freq: Every day | ORAL | 11 refills | Status: DC

## 2023-03-05 NOTE — Telephone Encounter (Signed)
 Patients insurance changed and her solifenacin (VESICARE) 10 MG tablet is too expensive through [facility]. She would like a refill sent to the [facility] pharmacy on [address]. Which I believe is this one: [address], [place], [place] [address].  She has 10 pills left and would just  like a refill until her next appt on 04/08

## 2023-03-08 MED ORDER — SOLIFENACIN SUCCINATE 10 MG PO TABS
10.0000 mg | ORAL_TABLET | Freq: Every day | ORAL | 0 refills | Status: DC
Start: 2023-03-08 — End: 2023-04-20

## 2023-03-08 NOTE — Addendum Note (Signed)
 Addended by: Carolin Coy on: 03/08/2023 02:14 PM   Modules accepted: Orders

## 2023-03-08 NOTE — Telephone Encounter (Signed)
 Attempted to contact patient to let her know the medication has been sent to the pharmacy as requested. VM has not been set up.

## 2023-04-13 ENCOUNTER — Ambulatory Visit: Payer: Medicare Other | Admitting: Urology

## 2023-04-13 NOTE — Progress Notes (Deleted)
 Assessment: 1. Urge incontinence      Plan: I personally reviewed the patient's chart including provider notes, and lab results. I reviewed records from [facility]. Continue Solifenacin 10 mg daily.  Refill sent. Return to office in 1 year.  Chief Complaint:  No chief complaint on file.   History of Present Illness:  Pamela Olson is a 73 y.o. female who is seen for continued evaluation of urge incontinence, nocturia, and frequency.  She was previously followed at [facility] in [facility].  Her last visit with me was in 6/22.  She was initially seen in early 2015 and was given a trial of Toviaz without benefit.  She was given samples of Vesicare 5 mg and reported improvement in her symptoms with the medication.  She noted better results with 10 mg dose.  She continued on Vesicare 10 mg daily which has worked well for her symptoms.  No side effects from the medication.  Her U/A from 7/19 showed 3-8 RBCs. At her visit in 6/22, she continued on Vesicare 10 mg daily. Her symptoms were well controlled with the medication. She occasionally forgets to take the medication and has an increase in symptoms. No side effects from the medication. No dysuria or gross hematuria.   She presents today for follow-up.  She continues on Solifenacin 10 mg daily.  She reports good control of her urinary symptoms when she takes the medication.  No side effects.  No dysuria or gross hematuria.   Past Medical History:  Past Medical History:  Diagnosis Date   GERD (gastroesophageal reflux disease)    Hypertension    Neuropathic pain of right foot    Pulmonary emboli [facility])     Past Surgical History:  Past Surgical History:  Procedure Laterality Date   HERNIA REPAIR     ROTATOR CUFF REPAIR Right 2013    Allergies:  Allergies  Allergen Reactions   Asa [Aspirin] Other (See Comments)    GI symptoms, States "the Dr told me not to take it because of my hernia" and acid reflux    Codeine Other (See Comments)    Dizziness   Tramadol Itching    Family History:  No family history on file.  Social History:  Social History   Tobacco Use   Smoking status: Former   Smokeless tobacco: Never  Substance Use Topics   Alcohol use: No   Drug use: No    Review of symptoms:  Constitutional:  Negative for unexplained weight loss, night sweats, fever, chills ENT:  Negative for nose bleeds, sinus pain, painful swallowing CV:  Negative for chest pain, shortness of breath, exercise intolerance, palpitations, loss of consciousness Resp:  Negative for cough, wheezing, shortness of breath GI:  Negative for nausea, vomiting, diarrhea, bloody stools GU:  Positives noted in HPI; otherwise negative for gross hematuria, dysuria Neuro:  Negative for seizures, poor balance, limb weakness, slurred speech Psych:  Negative for lack of energy, depression, anxiety Endocrine:  Negative for polydipsia, polyuria, symptoms of hypoglycemia (dizziness, hunger, sweating) Hematologic:  Negative for anemia, purpura, petechia, prolonged or excessive bleeding, use of anticoagulants  Allergic:  Negative for difficulty breathing or choking as a result of exposure to anything; no shellfish allergy; no allergic response (rash/itch) to materials, foods  Physical exam: There were no vitals taken for this visit. GENERAL APPEARANCE:  Well appearing, well developed, well nourished, NAD HEENT:  Atraumatic, normocephalic, oropharynx clear NECK:  Supple without lymphadenopathy or thyromegaly ABDOMEN:  Soft,  non-tender, no masses EXTREMITIES:  Moves all extremities well, without clubbing, cyanosis, or edema NEUROLOGIC:  Alert and oriented x 3, normal gait, CN II-XII grossly intact MENTAL STATUS:  appropriate BACK:  Non-tender to palpation, No CVAT SKIN:  Warm, dry, and intact  Results: No specimen provided

## 2023-04-20 ENCOUNTER — Encounter: Payer: Self-pay | Admitting: Urology

## 2023-04-20 ENCOUNTER — Ambulatory Visit: Admitting: Urology

## 2023-04-20 VITALS — BP 179/73 | HR 67 | Ht 61.0 in | Wt 151.0 lb

## 2023-04-20 DIAGNOSIS — N3941 Urge incontinence: Secondary | ICD-10-CM

## 2023-04-20 LAB — MICROSCOPIC EXAMINATION

## 2023-04-20 LAB — URINALYSIS, ROUTINE W REFLEX MICROSCOPIC
Bilirubin, UA: NEGATIVE
Glucose, UA: NEGATIVE
Ketones, UA: NEGATIVE
Nitrite, UA: NEGATIVE
Protein,UA: NEGATIVE
RBC, UA: NEGATIVE
Specific Gravity, UA: 1.02 (ref 1.005–1.030)
Urobilinogen, Ur: 0.2 mg/dL (ref 0.2–1.0)
pH, UA: 7 (ref 5.0–7.5)

## 2023-04-20 MED ORDER — OXYBUTYNIN CHLORIDE ER 10 MG PO TB24: 10.0000 mg | ORAL_TABLET | Freq: Every day | ORAL | 3 refills | Status: DC

## 2023-04-20 NOTE — Progress Notes (Signed)
 Assessment: 1. Urge incontinence     Plan: Trial of oxybutynin XL 10 mg nightly in place of solifenacin.  Prescription sent to mail order.   Return to office in 1 year.  Chief Complaint:  Chief Complaint  Patient presents with   Urinary Incontinence    History of Present Illness:  Pamela Olson is a 73 y.o. female who is seen for continued evaluation of urge incontinence, nocturia, and frequency.  She was previously followed at [facility] in [facility].  Her last visit with me was in 6/22.  She was initially seen in early 2015 and was given a trial of Toviaz without benefit.  She was given samples of Vesicare 5 mg and reported improvement in her symptoms with the medication.  She noted better results with 10 mg dose.  She continued on Vesicare 10 mg daily which has worked well for her symptoms.  No side effects from the medication.  Her U/A from 7/19 showed 3-8 RBCs. At her visit in 6/22, she continued on Vesicare 10 mg daily. Her symptoms were well controlled with the medication. She occasionally forgets to take the medication and has an increase in symptoms. No side effects from the medication. No dysuria or gross hematuria.  At her visit in 3/24, she continued on Solifenacin 10 mg daily.  She reported good control of her urinary symptoms when taking the medication.  No side effects.  No dysuria or gross hematuria.  She returns today for annual follow-up.  She has continued on solifenacin 10 mg nightly.  Her symptoms have been well-controlled.  She does have nocturia 2-3 times.  No incontinence.  No dysuria or gross hematuria.  She is interested in trying a different medication due to the cost of the solifenacin.  Portions of the above documentation were copied from a prior visit for review purposes only.  Past Medical History:  Past Medical History:  Diagnosis Date   GERD (gastroesophageal reflux disease)    Hypertension    Neuropathic pain of right foot     Pulmonary emboli [facility])     Past Surgical History:  Past Surgical History:  Procedure Laterality Date   HERNIA REPAIR     ROTATOR CUFF REPAIR Right 2013    Allergies:  Allergies  Allergen Reactions   Asa [Aspirin] Other (See Comments)    GI symptoms, States "the Dr told me not to take it because of my hernia" and acid reflux   Codeine Other (See Comments)    Dizziness   Tramadol Itching    Family History:  No family history on file.  Social History:  Social History   Tobacco Use   Smoking status: Former   Smokeless tobacco: Never  Substance Use Topics   Alcohol use: No   Drug use: No    Review of symptoms:  Constitutional:  Negative for unexplained weight loss, night sweats, fever, chills ENT:  Negative for nose bleeds, sinus pain, painful swallowing CV:  Negative for chest pain, shortness of breath, exercise intolerance, palpitations, loss of consciousness Resp:  Negative for cough, wheezing, shortness of breath GI:  Negative for nausea, vomiting, diarrhea, bloody stools GU:  Positives noted in HPI; otherwise negative for gross hematuria, dysuria Neuro:  Negative for seizures, poor balance, limb weakness, slurred speech Psych:  Negative for lack of energy, depression, anxiety Endocrine:  Negative for polydipsia, polyuria, symptoms of hypoglycemia (dizziness, hunger, sweating) Hematologic:  Negative for anemia, purpura, petechia, prolonged or excessive bleeding, use of anticoagulants  Allergic:  Negative for difficulty breathing or choking as a result of exposure to anything; no shellfish allergy; no allergic response (rash/itch) to materials, foods  Physical exam: BP (!) 179/73   Pulse 67   Ht 5\' 1"  (1.549 m)   Wt 151 lb (68.5 kg)   BMI 28.53 kg/m  GENERAL APPEARANCE:  Well appearing, well developed, well nourished, NAD HEENT:  Atraumatic, normocephalic, oropharynx clear NECK:  Supple without lymphadenopathy or thyromegaly ABDOMEN:  Soft, non-tender, no  masses EXTREMITIES:  Moves all extremities well, without clubbing, cyanosis, or edema NEUROLOGIC:  Alert and oriented x 3, normal gait, CN II-XII grossly intact MENTAL STATUS:  appropriate BACK:  Non-tender to palpation, No CVAT SKIN:  Warm, dry, and intact  Results: U/A:

## 2023-04-30 ENCOUNTER — Ambulatory Visit: Admitting: Urology

## 2023-08-02 ENCOUNTER — Emergency Department (HOSPITAL_BASED_OUTPATIENT_CLINIC_OR_DEPARTMENT_OTHER)

## 2023-08-02 ENCOUNTER — Encounter (HOSPITAL_BASED_OUTPATIENT_CLINIC_OR_DEPARTMENT_OTHER): Payer: Self-pay | Admitting: Emergency Medicine

## 2023-08-02 ENCOUNTER — Emergency Department (HOSPITAL_BASED_OUTPATIENT_CLINIC_OR_DEPARTMENT_OTHER): Admission: EM | Admit: 2023-08-02 | Discharge: 2023-08-02 | Disposition: A | Discharge status: Home / Self Care

## 2023-08-02 ENCOUNTER — Other Ambulatory Visit: Payer: Self-pay

## 2023-08-02 DIAGNOSIS — R519 Headache, unspecified: Secondary | ICD-10-CM | POA: Diagnosis present

## 2023-08-02 DIAGNOSIS — Z7901 Long term (current) use of anticoagulants: Secondary | ICD-10-CM | POA: Diagnosis not present

## 2023-08-02 DIAGNOSIS — R42 Dizziness and giddiness: Secondary | ICD-10-CM | POA: Insufficient documentation

## 2023-08-02 LAB — BASIC METABOLIC PANEL WITH GFR
Anion gap: 12 (ref 5–15)
BUN: 18 mg/dL (ref 8–23)
CO2: 23 mmol/L (ref 22–32)
Calcium: 9.2 mg/dL (ref 8.9–10.3)
Chloride: 102 mmol/L (ref 98–111)
Creatinine, Ser: 0.87 mg/dL (ref 0.44–1.00)
GFR, Estimated: >60 mL/min (ref >60)
Glucose, Bld: 105 mg/dL — ABNORMAL HIGH (ref 70–99)
Potassium: 3.7 mmol/L (ref 3.5–5.1)
Sodium: 137 mmol/L (ref 135–145)

## 2023-08-02 LAB — CBC
HCT: 36.4 % (ref 36.0–46.0)
Hemoglobin: 11.9 g/dL — ABNORMAL LOW (ref 12.0–15.0)
MCH: 26.6 pg (ref 26.0–34.0)
MCHC: 32.7 g/dL (ref 30.0–36.0)
MCV: 81.4 fL (ref 80.0–100.0)
Platelets: 207 K/uL (ref 150–400)
RBC: 4.47 MIL/uL (ref 3.87–5.11)
RDW: 14.3 % (ref 11.5–15.5)
WBC: 7.5 K/uL (ref 4.0–10.5)
nRBC: 0 % (ref 0.0–0.2)

## 2023-08-02 LAB — TROPONIN T, HIGH SENSITIVITY: Troponin T High Sensitivity: <15 ng/L (ref <19)

## 2023-08-02 MED ORDER — IOHEXOL 350 MG/ML SOLN
75.0000 mL | Freq: Once | INTRAVENOUS | Status: AC | PRN
  Administered 2023-08-02: 75 mL via INTRAVENOUS

## 2023-08-02 MED ORDER — MECLIZINE HCL 25 MG PO TABS
25.0000 mg | ORAL_TABLET | Freq: Three times a day (TID) | ORAL | 0 refills | Status: AC | PRN
  Filled 2023-08-02: qty 21, 7d supply, fill #0

## 2023-08-02 NOTE — ED Provider Notes (Signed)
 [place] [facility] Provider Note   CSN: [id number] Arrival date & time: 08/02/23  1609     Patient presents with: Headache   Pamela Olson is a 73 y.o. female.  {Add pertinent medical, surgical, social history, OB history to HPI:9112} 73 year old female presents for evaluation of headache and dizziness.  States she has no history of migraines does not usually get headaches.  For the last 2 days she has noticed some headaches and felt very dizzy.  States he feels like the room is spinning and she is off balance.  Occasionally states she feels that she is going to faint as well.  Denies any numbness or tingling but has had increased difficulty walking.  Denies any other symptoms or concerns at this time.   Headache Associated symptoms: dizziness   Associated symptoms: no abdominal pain, no back pain, no cough, no ear pain, no eye pain, no fever, no seizures, no sore throat and no vomiting        Prior to Admission medications   Medication Sig Start Date End Date Taking? Authorizing Provider  atorvastatin (LIPITOR) 40 MG tablet Take 40 mg by mouth daily. 04/06/22   [provider]  azaTHIOprine (IMURAN) 50 MG tablet Take 50 mg by mouth daily. 04/23/22   [provider]  gabapentin  (NEURONTIN ) 300 MG capsule Take 300 mg by mouth 3 (three) times daily.    [provider]  losartan  (COZAAR ) 100 MG tablet Take 100 mg by mouth daily.    [provider]  meclizine  (ANTIVERT ) 25 MG tablet Take 1 tablet (25 mg total) by mouth 2 (two) times daily as needed for dizziness. 06/24/22   Elnor Jayson LABOR, DO  omeprazole  (PRILOSEC) 40 MG capsule Take 40 mg by mouth daily. 04/01/22   [provider]  ondansetron  (ZOFRAN  ODT) 8 MG disintegrating tablet Take 1 tablet (8 mg total) by mouth every 8 (eight) hours as needed. Patient not taking: Reported on 06/24/2022 05/09/20   Molpus, Norleen, MD  oxybutynin  (DITROPAN -XL) 10 MG 24 hr  tablet Take 1 tablet (10 mg total) by mouth at bedtime. 04/20/23   Stoneking, Adine PARAS., MD  Rivaroxaban  (XARELTO ) 15 MG TABS tablet Take 1 tablet (15 mg total) by mouth 2 (two) times daily with a meal. Note patient to get future prescriptions through PCP Patient not taking: Reported on 06/24/2022 03/07/16   Dennise Lavada POUR, MD  rivaroxaban  (XARELTO ) 20 MG TABS tablet Take 20 mg by mouth daily with supper.    [provider]  traZODone (DESYREL) 50 MG tablet Take 50 mg by mouth at bedtime as needed for sleep.  09/27/15   [provider]  Vitamin D, Ergocalciferol, (DRISDOL) 1.25 MG (50000 UNIT) CAPS capsule Take 50,000 Units by mouth once a week. Mondays 04/08/22   [provider]    Allergies: Asa [aspirin], Codeine, and Tramadol    Review of Systems  Constitutional:  Negative for chills and fever.  HENT:  Negative for ear pain and sore throat.   Eyes:  Negative for pain and visual disturbance.  Respiratory:  Negative for cough and shortness of breath.   Cardiovascular:  Negative for chest pain and palpitations.  Gastrointestinal:  Negative for abdominal pain and vomiting.  Genitourinary:  Negative for dysuria and hematuria.  Musculoskeletal:  Negative for arthralgias and back pain.  Skin:  Negative for color change and rash.  Neurological:  Positive for dizziness and headaches. Negative for seizures and syncope.  All  other systems reviewed and are negative.   Updated Vital Signs BP (!) 154/81 (BP Location: Left Arm)   Pulse 62   Temp 98.2 F (36.8 C) (Oral)   Resp 17   Ht 5' 5 (1.651 m)   Wt 68 kg   SpO2 98%   BMI 24.96 kg/m   Physical Exam Vitals and nursing note reviewed.  Constitutional:      General: She is not in acute distress.    Appearance: She is well-developed.  HENT:     Head: Normocephalic and atraumatic.  Eyes:     Conjunctiva/sclera: Conjunctivae normal.  Cardiovascular:     Rate and Rhythm: Normal rate and regular rhythm.      Heart sounds: No murmur heard. Pulmonary:     Effort: Pulmonary effort is normal. No respiratory distress.     Breath sounds: Normal breath sounds.  Abdominal:     Palpations: Abdomen is soft.     Tenderness: There is no abdominal tenderness.  Musculoskeletal:        General: No swelling.     Cervical back: Neck supple.  Skin:    General: Skin is warm and dry.     Capillary Refill: Capillary refill takes less than 2 seconds.  Neurological:     Mental Status: She is alert. Mental status is at baseline.     Cranial Nerves: No cranial nerve deficit.     Sensory: No sensory deficit.  Psychiatric:        Mood and Affect: Mood normal.     (all labs ordered are listed, but only abnormal results are displayed) Labs Reviewed  CBC - Abnormal; Notable for the following components:      Result Value   Hemoglobin 11.9 (*)    All other components within normal limits  BASIC METABOLIC PANEL WITH GFR - Abnormal; Notable for the following components:   Glucose, Bld 105 (*)    All other components within normal limits  TROPONIN T, HIGH SENSITIVITY    EKG: None  Radiology: No results found.  {Document cardiac monitor, telemetry assessment procedure when appropriate:32947} Procedures   Medications Ordered in the ED - No data to display    {Click here for ABCD2, HEART and other calculators REFRESH Note before signing:1}                              Medical Decision Making Amount and/or Complexity of Data Reviewed Labs: ordered. Radiology: ordered.   ***  {Document critical care time when appropriate  Document review of labs and clinical decision tools ie CHADS2VASC2, etc  Document your independent review of radiology images and any outside records  Document your discussion with family members, caretakers and with consultants  Document social determinants of health affecting pt's care  Document your decision making why or why not admission, treatments were needed:32947:::1}    Final diagnoses:  None    ED Discharge Orders     None

## 2023-08-02 NOTE — ED Triage Notes (Signed)
 Pt reports HA and dizziness x 3 weeks, denies n/v, vision or speech changes, AOx4, neuro assessment WNL, denies hx of migraines

## 2023-08-02 NOTE — Discharge Instructions (Addendum)
 Take your medications as prescribed. You can use tylenol  as needed for headache. You can use meclizine  as needed for dizziness. Drink plenty of water. Keep an eye of your blood pressure and follow up with your primary care provider.

## 2023-08-03 ENCOUNTER — Other Ambulatory Visit (HOSPITAL_BASED_OUTPATIENT_CLINIC_OR_DEPARTMENT_OTHER): Payer: Self-pay

## 2023-12-08 ENCOUNTER — Other Ambulatory Visit: Payer: Self-pay | Admitting: Urology

## 2023-12-08 DIAGNOSIS — N3941 Urge incontinence: Secondary | ICD-10-CM

## 2023-12-08 MED ORDER — TROSPIUM CHLORIDE 20 MG PO TABS: 20.0000 mg | ORAL_TABLET | Freq: Two times a day (BID) | ORAL | 3 refills | Status: DC

## 2024-01-19 ENCOUNTER — Other Ambulatory Visit: Payer: Self-pay | Admitting: Urology

## 2024-01-19 DIAGNOSIS — N3941 Urge incontinence: Secondary | ICD-10-CM

## 2024-01-19 MED ORDER — SOLIFENACIN SUCCINATE 10 MG PO TABS: 10.0000 mg | ORAL_TABLET | Freq: Every day | ORAL | 3 refills | Status: AC

## 2024-01-20 ENCOUNTER — Telehealth: Payer: Self-pay

## 2024-01-20 NOTE — Telephone Encounter (Signed)
-----   Message from Adine Manly, MD sent at 01/19/2024  4:14 PM EST ----- Regarding: FW: Medication change Please notify patient that I can send in a rx for Vesicare  10 mg to [facility] and she can use a GoodRx coupon to get 90 pills for $40.  This medication was previously working well for her until the cost increased. ----- Message ----- From: Pamela Olson Sent: 01/19/2024   2:22 PM EST To: Adine DOROTHA Manly, MD Subject: Medication change                              Patient called and stated the med that she was prescribed was too much money and would like to know if there are any alternatives that she can take.

## 2024-01-20 NOTE — Telephone Encounter (Signed)
 Attempted to reach pt, no answer, no VM set up, unable to leave a message.

## 2024-04-19 ENCOUNTER — Ambulatory Visit: Admitting: Urology
